# Patient Record
Sex: Female | Born: 1955 | Race: White | Hispanic: No | Marital: Married | Smoking: Never smoker
Health system: Southern US, Community
[De-identification: ages and names within clinical notes are randomized; demographics above are authoritative.]

## PROBLEM LIST (undated history)

## (undated) DIAGNOSIS — I10 Essential (primary) hypertension: Secondary | ICD-10-CM

## (undated) DIAGNOSIS — G5601 Carpal tunnel syndrome, right upper limb: Secondary | ICD-10-CM

## (undated) HISTORY — DX: Essential (primary) hypertension: I10

## (undated) HISTORY — PX: KNEE ARTHROSCOPY: SHX127

## (undated) HISTORY — DX: Carpal tunnel syndrome, right upper limb: G56.01

## (undated) HISTORY — PX: HAMMER TOE SURGERY: SHX385

---

## 2007-01-16 ENCOUNTER — Ambulatory Visit (HOSPITAL_BASED_OUTPATIENT_CLINIC_OR_DEPARTMENT_OTHER): Admission: RE | Admit: 2007-01-16 | Discharge: 2007-01-16 | Payer: Self-pay | Admitting: Orthopedic Surgery

## 2010-10-23 NOTE — Op Note (Signed)
NAME:  Faith Montgomery, Faith Montgomery              ACCOUNT NO.:  [id number]   MEDICAL RECORD NO.:  [id number]          PATIENT TYPE:  AMB   LOCATION:  DSC                          FACILITY:  [facility]   PHYSICIAN:  Dyke Brackett, M.D.    DATE OF BIRTH:  1956-03-29   DATE OF PROCEDURE:  DATE OF DISCHARGE:                               OPERATIVE REPORT   INDICATIONS FOR PROCEDURE:  55 year old with medial joint line pain and  locking of the right knee, thought to be amenable to outpatient surgery,  not responding to conservative treatment.   PREOPERATIVE DIAGNOSIS:  Torn medial meniscus.   POSTOPERATIVE DIAGNOSIS:  1. Torn medial meniscus.  2. Osteoarthritis of the right knee.   OPERATION:  1. Partial medial meniscectomy.  2. Debridement and chondroplasty patellofemoral joint.   SURGEON:  Dyke Brackett, MD   ANESTHESIA:  General with local.   DESCRIPTION OF PROCEDURE:  Inferomedial and inferolateral portals  created.  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  The lateral compartment was normal.  The medial meniscus  showed a complex tear of the posterior horn of the medial meniscus  requiring resection of 30-40% of the meniscal substance.  Mild  degenerative change overlying this compartment was debrided and again,  separate debridement chondroplasty of this patellofemoral joint carried  out separate from the medial meniscectomy.  The knee drained free of  fluid.  Portals were closed with nylon, infiltrated with Marcaine and  morphine with some additional Marcaine in the portals.  Patient taken to  recovery room in stable condition.      Dyke Brackett, M.D.  Electronically Signed     WDC/MEDQ  D:  01/16/2007  T:  01/17/2007  Job:  [id number]

## 2011-05-14 ENCOUNTER — Ambulatory Visit (HOSPITAL_COMMUNITY)
Admission: RE | Admit: 2011-05-14 | Discharge: 2011-05-14 | Disposition: A | Discharge status: Home / Self Care | Payer: BC Managed Care – PPO | Source: Ambulatory Visit | Attending: Orthopedic Surgery | Admitting: Orthopedic Surgery

## 2011-05-14 DIAGNOSIS — IMO0001 Reserved for inherently not codable concepts without codable children: Secondary | ICD-10-CM | POA: Insufficient documentation

## 2011-05-14 DIAGNOSIS — R262 Difficulty in walking, not elsewhere classified: Secondary | ICD-10-CM | POA: Insufficient documentation

## 2011-05-14 DIAGNOSIS — M25669 Stiffness of unspecified knee, not elsewhere classified: Secondary | ICD-10-CM | POA: Insufficient documentation

## 2011-05-14 DIAGNOSIS — M25569 Pain in unspecified knee: Secondary | ICD-10-CM | POA: Insufficient documentation

## 2011-05-14 NOTE — Progress Notes (Signed)
Physical Therapy Evaluation  Patient Details  Name: Faith Montgomery MRN: [id number] Date of Birth: 06-02-1956  Today's Date: 05/14/2011 Time: 4098-1191 Time Calculation (min): 31 min Visit#: 1  of 1   Re-eval:   Assessment Diagnosis: L arthroscopic surgery with stiffness Surgical Date: 05/08/11 Next MD Visit: 06/13/2011 Prior Therapy: none  Past Medical History: No past medical history on file. Past Surgical History: No past surgical history on file.  Subjective Symptoms/Limitations Symptoms: Faith Montgomery states that she torn her meniscus and had arthroscopic surgery on 05/08/11.  The pateint states that she is doing much better since the surgery. How long can you sit comfortably?: The patient states that she can sit comfortable for a long time but if she goes to get up her knee feels stiff. How long can you stand comfortably?: The patietn states that she has not really stood for a long time but showering and washing dishes is no problem. How long can you walk comfortably?: The patient states that she has walked for 30 minutes without difficulty. 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 Leisure: Hobbies-yes (Comment) Comments: clean houses.  Sensation/Coordination/Flexibility    Assessment LLE AROM (degrees) Left Knee Extension 0-130: 0  Left Knee Flexion 0-140: 130  LLE Strength Left Hip Flexion: 5/5 Left Hip Extension: 5/5 Left Hip ABduction: 5/5 Left Hip ADduction: 5/5 Left Knee Flexion: 5/5 Left Knee Extension: 5/5 Left Ankle Dorsiflexion: 5/5  Exercise/Treatments Stretches Quad Stretch: 3 reps;30 seconds   Standing Heel Raises: 10 reps Functional Squat: 10 reps SLS:  (3 x 12 sec.)    Supine Quad Sets: 5 reps      Physical  Therapy Assessment and Plan PT Assessment and Plan Clinical Impression Statement: Pt demonstrates good ROM and strength.  Pt will be able to reach full prior level of functioning with a HEP Clinical Impairments Affecting Rehab Potential: stiffness PT Plan: Pt to be seen one time for a HEP    Goals Home Exercise Program Pt will Perform Home Exercise Program: Independently PT Short Term Goals Time to Complete Short Term Goals: 2 weeks  Problem List There is no problem list on file for this patient.   PT - End of Session Activity Tolerance: Patient tolerated treatment well General Behavior During Session: [facility] for tasks performed Cognition: [facility] for tasks performed   RUSSELL,CINDY 05/14/2011, 9:51 AM  Physician Documentation Your signature is required to indicate approval of the treatment plan as stated above.  Please sign and either send electronically or make a copy of this report for your files and return this physician signed original.   Please mark one 1.__approve of plan  2. ___approve of plan with the following conditions.   ______________________________                                                          _____________________ Physician Signature  Date  

## 2011-05-14 NOTE — Patient Instructions (Addendum)
HEP

## 2012-03-16 LAB — HM PAP SMEAR: HM Pap smear: NORMAL

## 2012-03-20 ENCOUNTER — Other Ambulatory Visit: Payer: Self-pay | Admitting: Family Medicine

## 2012-03-20 DIAGNOSIS — Z139 Encounter for screening, unspecified: Secondary | ICD-10-CM

## 2012-03-23 ENCOUNTER — Ambulatory Visit (HOSPITAL_COMMUNITY)
Admission: RE | Admit: 2012-03-23 | Discharge: 2012-03-23 | Disposition: A | Discharge status: Home / Self Care | Payer: BC Managed Care – PPO | Source: Ambulatory Visit | Attending: Family Medicine | Admitting: Family Medicine

## 2012-03-23 DIAGNOSIS — Z1231 Encounter for screening mammogram for malignant neoplasm of breast: Secondary | ICD-10-CM | POA: Insufficient documentation

## 2012-03-23 DIAGNOSIS — Z139 Encounter for screening, unspecified: Secondary | ICD-10-CM

## 2012-04-02 ENCOUNTER — Telehealth: Payer: Self-pay

## 2012-04-02 NOTE — Telephone Encounter (Signed)
LMOM to call.

## 2012-04-20 NOTE — Telephone Encounter (Signed)
Pt said she had to go with someone in [place], she was told by her insurance that we were not in her network. She said she was sent to Dr. Loreta Ave. Sending letter to PCP.

## 2013-03-16 ENCOUNTER — Other Ambulatory Visit: Payer: Self-pay | Admitting: Family Medicine

## 2013-03-16 DIAGNOSIS — Z0000 Encounter for general adult medical examination without abnormal findings: Secondary | ICD-10-CM

## 2013-03-17 ENCOUNTER — Other Ambulatory Visit: Payer: BC Managed Care – PPO

## 2013-03-17 DIAGNOSIS — Z0000 Encounter for general adult medical examination without abnormal findings: Secondary | ICD-10-CM

## 2013-03-17 LAB — CBC WITH DIFFERENTIAL/PLATELET
Basophils Absolute: 0 10*3/uL (ref 0.0–0.1)
Basophils Relative: 1 % (ref 0–1)
Eosinophils Absolute: 0.2 10*3/uL (ref 0.0–0.7)
Eosinophils Relative: 4 % (ref 0–5)
HCT: 41.6 % (ref 36.0–46.0)
Hemoglobin: 13.9 g/dL (ref 12.0–15.0)
MCH: 30.3 pg (ref 26.0–34.0)
MCHC: 33.4 g/dL (ref 30.0–36.0)
MCV: 90.8 fL (ref 78.0–100.0)
Monocytes Absolute: 0.3 10*3/uL (ref 0.1–1.0)
Monocytes Relative: 8 % (ref 3–12)
RDW: 13.2 % (ref 11.5–15.5)

## 2013-03-17 LAB — COMPREHENSIVE METABOLIC PANEL
Albumin: 4.4 g/dL (ref 3.5–5.2)
Alkaline Phosphatase: 69 U/L (ref 39–117)
BUN: 14 mg/dL (ref 6–23)
Creat: 0.68 mg/dL (ref 0.50–1.10)
Glucose, Bld: 103 mg/dL — ABNORMAL HIGH (ref 70–99)
Potassium: 4.9 mEq/L (ref 3.5–5.3)
Total Bilirubin: 0.6 mg/dL (ref 0.3–1.2)

## 2013-03-17 LAB — LIPID PANEL
HDL: 59 mg/dL (ref >39)
LDL Cholesterol: 106 mg/dL — ABNORMAL HIGH (ref 0–99)
Total CHOL/HDL Ratio: 3.1 Ratio
Triglycerides: 73 mg/dL (ref <150)

## 2013-03-25 ENCOUNTER — Encounter: Payer: Self-pay | Admitting: Physician Assistant

## 2013-03-25 ENCOUNTER — Ambulatory Visit (INDEPENDENT_AMBULATORY_CARE_PROVIDER_SITE_OTHER): Payer: BC Managed Care – PPO | Admitting: Physician Assistant

## 2013-03-25 VITALS — BP 110/78 | HR 84 | Temp 97.9°F | Resp 18 | Ht 60.5 in | Wt 161.0 lb

## 2013-03-25 DIAGNOSIS — Z0000 Encounter for general adult medical examination without abnormal findings: Secondary | ICD-10-CM

## 2013-03-25 DIAGNOSIS — I10 Essential (primary) hypertension: Secondary | ICD-10-CM

## 2013-03-25 DIAGNOSIS — Z23 Encounter for immunization: Secondary | ICD-10-CM

## 2013-03-25 NOTE — Progress Notes (Signed)
Patient ID: Faith Montgomery MRN: [id number], DOB: 11/17/1955, 57 y.o. Date of Encounter: 03/25/2013,   Chief Complaint: Physical (CPE)  HPI: 57 y.o. y/o white female  here for CPE.   She has no complaints and has been feeling good.  She is taking her blood pressure medication as directed. No adverse effects. No lightheadedness.   Review of Systems: Consitutional: No fever, chills, fatigue, night sweats, lymphadenopathy. No significant/unexplained weight changes. Eyes: No visual changes, eye redness, or discharge. ENT/Mouth: No ear pain, sore throat, nasal drainage, or sinus pain. Cardiovascular: No chest pressure,heaviness, tightness or squeezing, even with exertion. No increased shortness of breath or dyspnea on exertion.No palpitations, edema, orthopnea, PND. Respiratory: No cough, hemoptysis, SOB, or wheezing. Gastrointestinal: No anorexia, dysphagia, reflux, pain, nausea, vomiting, hematemesis, diarrhea, constipation, BRBPR, or melena. Breast: No mass, nodules, bulging, or retraction. No skin changes or inflammation. No nipple discharge. No lymphadenopathy. Genitourinary: No dysuria, hematuria, incontinence, vaginal discharge, pruritis, burning, abnormal bleeding, or pain. Musculoskeletal: No decreased ROM, No joint pain or swelling. No significant pain in neck, back, or extremities. Skin: No rash, pruritis, or concerning lesions. Neurological: No headache, dizziness, syncope, seizures, tremors, memory loss, coordination problems, or paresthesias. Psychological: No anxiety, depression, hallucinations, SI/HI. Endocrine: No polydipsia, polyphagia, polyuria, or known diabetes.No increased fatigue. No palpitations/rapid heart rate. No significant/unexplained weight change. All other systems were reviewed and are otherwise negative.  Past Medical History  Diagnosis Date  . Hypertension      Past Surgical History  Procedure Laterality Date  . Knee arthroscopy      both knees at  different times    Home Meds:  Benazepril 10 mg 1 by mouth daily  Allergies: No Known Allergies  History   Social History  . Marital Status: Married    Spouse Name: N/A    Number of Children: N/A  . Years of Education: N/A   Occupational History  . School [employer]   . Cleans Houses    Social History Main Topics  . Smoking status: Never Smoker   . Smokeless tobacco: Never Used  . Alcohol Use: No  . Drug Use: No  . Sexual Activity: Yes    Birth Control/ Protection: Post-menopausal   Other Topics Concern  . Not on file   Social History Narrative   Married. Lives with husband.    3 children. 1 Boy: Age 54             (as of 20)                      2 Girls: 16, 57 y/o      (as of 2014)      Works in [employer].   And cleans houses.      Walks with husband every night for 30 minutes.    Family History  Problem Relation Age of Onset  . Heart disease Father   . Diabetes Father   . Stroke Brother 50    stroke    Physical Exam: Blood pressure 110/78, pulse 84, temperature 97.9 F (36.6 C), temperature source Oral, resp. rate 18, height 5' 0.5" (1.537 m), weight 161 lb (73.029 kg)., Body mass index is 30.91 kg/(m^2). General: Well developed, well nourished,WF. Appears in no acute distress. HEENT: Normocephalic, atraumatic. Conjunctiva pink, sclera non-icteric. Pupils 2 mm constricting to 1 mm, round, regular, and equally reactive to light and accomodation. EOMI. Internal auditory canal clear. TMs with good cone of light and without pathology.  Nasal mucosa pink. Nares are without discharge. No sinus tenderness. Oral mucosa pink.  Pharynx without exudate.   Neck: Supple. Trachea midline. No thyromegaly. Full ROM. No lymphadenopathy.No Carotid Bruits. Lungs: Clear to auscultation bilaterally without wheezes, rales, or rhonchi. Breathing is of normal effort and unlabored. Cardiovascular: RRR with S1 S2. No murmurs, rubs, or gallops. Distal pulses 2+ symmetrically.  No carotid or abdominal bruits. Breast: Symmetrical. No masses. Nipples without discharge. Abdomen: Soft, non-tender, non-distended with normoactive bowel sounds. No hepatosplenomegaly or masses. No rebound/guarding. No CVA tenderness. No hernias.  Genitourinary:  External genitalia without lesions. Vaginal mucosa pink.No discharge present. Cervix pink and without discharge. No cervical tenderness.Normal uterus size. No adnexal mass or tenderness.   Musculoskeletal: Full range of motion and 5/5 strength throughout. Without swelling, atrophy, tenderness, crepitus, or warmth. Extremities without clubbing, cyanosis, or edema. Calves supple. Skin: Warm and moist without erythema, ecchymosis, wounds, or rash. Neuro: A+Ox3. CN II-XII grossly intact. Moves all extremities spontaneously. Full sensation throughout. Normal gait. DTR 2+ throughout upper and lower extremities. Finger to nose intact. Psych:  Responds to questions appropriately with a normal affect.   Assessment/Plan:  57 y.o. y/o female here for CPE 1. Visit for preventive health examination  A. greening labs: She came fasting and have these done 03/18/13. All are normal. This includes CBC, CMET, FLP,  TSH, vitamin D.  B. Pap Smear: She had Pap smear with me 03/16/12. This was negative for intraepithelial lesion or malignancy. That Pap smear was cytology alone so she will need a repeat in 3 years.  C. Screening mammogram: Last mammogram 03/23/12. Negative.     She is due for annual followup. I have given her the phone number to call to schedule this for herself.  D. colorectal cancer screening: We discussed this last year and I did referral to GI. She did go to the appointment with GI. However she says it her insurance coverage would not cover the colonoscopy. Therefore she did not have the procedure done. However she has been told it her insurance coverage has been changed and she feels that the knee plan doesn't cover the colonoscopy. She  is going to contact the insurance company to verify. If colonoscopy is covered and she will followup with having this performed. It is not covered and she needs to return here for Hemoccult cards.  E. Immunizations:  She is agreeable to have influenza vaccine today. Tetanus vaccine was given here 03/25/2012 as Tdap. Discuss Zostavax at age 31 Pneumovax at age 21.  2. Hypertension Pressure is at goal. Continue current medication. Lab is normal.  3. Need for prophylactic vaccination and inoculation against influenza - Flu Vaccine QUAD 36+ mos PF IM (Fluarix)   Signed, [address] [place], [place], [facility] 03/25/2013 3:43 PM

## 2013-03-29 ENCOUNTER — Other Ambulatory Visit: Payer: Self-pay | Admitting: Physician Assistant

## 2013-03-29 DIAGNOSIS — Z139 Encounter for screening, unspecified: Secondary | ICD-10-CM

## 2013-04-13 ENCOUNTER — Ambulatory Visit (HOSPITAL_COMMUNITY)
Admission: RE | Admit: 2013-04-13 | Discharge: 2013-04-13 | Disposition: A | Discharge status: Home / Self Care | Payer: BC Managed Care – PPO | Source: Ambulatory Visit | Attending: Physician Assistant | Admitting: Physician Assistant

## 2013-04-13 DIAGNOSIS — Z1231 Encounter for screening mammogram for malignant neoplasm of breast: Secondary | ICD-10-CM | POA: Insufficient documentation

## 2013-04-13 DIAGNOSIS — Z139 Encounter for screening, unspecified: Secondary | ICD-10-CM

## 2013-04-20 ENCOUNTER — Other Ambulatory Visit: Payer: Self-pay | Admitting: Physician Assistant

## 2013-09-30 ENCOUNTER — Ambulatory Visit (INDEPENDENT_AMBULATORY_CARE_PROVIDER_SITE_OTHER): Payer: BC Managed Care – PPO | Admitting: Family Medicine

## 2013-09-30 ENCOUNTER — Encounter: Payer: Self-pay | Admitting: Family Medicine

## 2013-09-30 VITALS — BP 110/78 | HR 74 | Temp 97.0°F | Resp 18 | Ht 60.5 in | Wt 163.0 lb

## 2013-09-30 DIAGNOSIS — R300 Dysuria: Secondary | ICD-10-CM

## 2013-09-30 DIAGNOSIS — I10 Essential (primary) hypertension: Secondary | ICD-10-CM

## 2013-09-30 MED ORDER — LOSARTAN POTASSIUM 50 MG PO TABS: 50.0000 mg | ORAL_TABLET | Freq: Every day | ORAL | Status: DC

## 2013-09-30 NOTE — Progress Notes (Signed)
Subjective:    Patient ID: Faith Montgomery, female    DOB: September 30, 1955, 58 y.o.   MRN: [id number]  HPI  Patient is a very pleasant 58 year old white female who has a history of hypertension. She's currently on benazepril 10 mg by mouth daily. Meds is working great for her blood pressure but she does complain of a daily persistent nagging cough. She denies a shortness of breath denies any hemoptysis. She also complains of a one month history of increased urinary frequency. The emergency room after suddenly in July to rest to the bathroom. Because when she gets there she is unable to urinate. She denies any dysuria. Unfortunately she is on a set today which makes the urinalysis unreliable. However her symptoms sound more like overactive bladder and less likely urinary tract infection. Overall she is doing well. I reviewed her lab work from October which was outstanding. She is not due for repeat labs until her physical in October. Past Medical History  Diagnosis Date  . Hypertension    Current Outpatient Prescriptions on File Prior to Visit  Medication Sig Dispense Refill  . benazepril (LOTENSIN) 10 MG tablet take 1 tablet by mouth once daily  30 tablet  11   No current facility-administered medications on file prior to visit.   No Known Allergies History   Social History  . Marital Status: Married    Spouse Name: N/A    Number of Children: N/A  . Years of Education: N/A   Occupational History  . School [employer]   . Cleans Houses    Social History Main Topics  . Smoking status: Never Smoker   . Smokeless tobacco: Never Used  . Alcohol Use: No  . Drug Use: No  . Sexual Activity: Yes    Birth Control/ Protection: Post-menopausal   Other Topics Concern  . Not on file   Social History Narrative   Married. Lives with husband.    3 children. 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      Walks with husband every night for 30 minutes.     Review of Systems  All other systems reviewed and are negative.      Objective:   Physical Exam  Vitals reviewed. Constitutional: She appears well-developed and well-nourished. No distress.  Neck: Neck supple. No JVD present. No thyromegaly present.  Cardiovascular: Normal rate, regular rhythm and normal heart sounds.   No murmur heard. Pulmonary/Chest: Effort normal and breath sounds normal. No respiratory distress. She has no wheezes. She has no rales. She exhibits no tenderness.  Abdominal: Soft. Bowel sounds are normal. She exhibits no distension. There is no tenderness. There is no rebound and no guarding.  Musculoskeletal: She exhibits no edema.  Lymphadenopathy:    She has no cervical adenopathy.  Skin: She is not diaphoretic.          Assessment & Plan:  1. HTN (hypertension) Blood pressures well controlled. I have asked the patient to discontinue benazepril and replace it with losartan 50 mg by mouth daily for her blood pressure. However this will help the cough subsided. She is due for a physical exam in  October. I would repeat all her lab work annually and her physical exam - losartan (COZAAR) 50 MG tablet; Take 1 tablet (50 mg total) by mouth daily.  Dispense: 90 tablet; Refill: 3  2. Dysuria I believe the patient has overactive bladder. Because of the azo, I will not check a urinalysis. I will send the patient's urine for a urine culture. If the culture is negative I will empirically try treating her for overactive bladder with VESIcare 10 mg by mouth daily. - Urine culture

## 2013-10-01 NOTE — Addendum Note (Signed)
Addended by: WRAY, [place] on: 10/01/2013 10:57 AM   Modules accepted: Orders

## 2013-10-03 LAB — URINE CULTURE: Colony Count: >=100000

## 2013-10-07 ENCOUNTER — Other Ambulatory Visit: Payer: Self-pay | Admitting: Family Medicine

## 2013-10-07 MED ORDER — CIPROFLOXACIN HCL 500 MG PO TABS: 500.0000 mg | ORAL_TABLET | Freq: Two times a day (BID) | ORAL | Status: DC

## 2014-03-22 ENCOUNTER — Other Ambulatory Visit: Payer: BC Managed Care – PPO

## 2014-03-22 DIAGNOSIS — Z79899 Other long term (current) drug therapy: Secondary | ICD-10-CM

## 2014-03-22 DIAGNOSIS — Z0000 Encounter for general adult medical examination without abnormal findings: Secondary | ICD-10-CM

## 2014-03-22 DIAGNOSIS — I10 Essential (primary) hypertension: Secondary | ICD-10-CM

## 2014-03-22 LAB — CBC WITH DIFFERENTIAL/PLATELET
Basophils Absolute: 0 10*3/uL (ref 0.0–0.1)
Basophils Relative: 1 % (ref 0–1)
EOS ABS: 0.1 10*3/uL (ref 0.0–0.7)
Eosinophils Relative: 3 % (ref 0–5)
HCT: 40.8 % (ref 36.0–46.0)
HEMOGLOBIN: 13.7 g/dL (ref 12.0–15.0)
LYMPHS ABS: 1.3 10*3/uL (ref 0.7–4.0)
LYMPHS PCT: 28 % (ref 12–46)
MCH: 30.3 pg (ref 26.0–34.0)
MCHC: 33.6 g/dL (ref 30.0–36.0)
MCV: 90.3 fL (ref 78.0–100.0)
MONOS PCT: 9 % (ref 3–12)
Monocytes Absolute: 0.4 10*3/uL (ref 0.1–1.0)
NEUTROS PCT: 59 % (ref 43–77)
Neutro Abs: 2.7 10*3/uL (ref 1.7–7.7)
PLATELETS: 180 10*3/uL (ref 150–400)
RBC: 4.52 MIL/uL (ref 3.87–5.11)
RDW: 14.1 % (ref 11.5–15.5)
WBC: 4.5 10*3/uL (ref 4.0–10.5)

## 2014-03-22 LAB — COMPLETE METABOLIC PANEL WITH GFR
ALBUMIN: 4.3 g/dL (ref 3.5–5.2)
ALK PHOS: 64 U/L (ref 39–117)
ALT: 10 U/L (ref 0–35)
AST: 14 U/L (ref 0–37)
BUN: 17 mg/dL (ref 6–23)
CALCIUM: 9.4 mg/dL (ref 8.4–10.5)
CHLORIDE: 105 meq/L (ref 96–112)
CO2: 27 mEq/L (ref 19–32)
Creat: 0.71 mg/dL (ref 0.50–1.10)
GFR, Est African American: >89 mL/min
GFR, Est Non African American: >89 mL/min
Glucose, Bld: 88 mg/dL (ref 70–99)
POTASSIUM: 4.3 meq/L (ref 3.5–5.3)
SODIUM: 140 meq/L (ref 135–145)
TOTAL PROTEIN: 6.6 g/dL (ref 6.0–8.3)
Total Bilirubin: 0.6 mg/dL (ref 0.2–1.2)

## 2014-03-22 LAB — LIPID PANEL
CHOLESTEROL: 168 mg/dL (ref 0–200)
HDL: 62 mg/dL (ref >39)
LDL Cholesterol: 96 mg/dL (ref 0–99)
Total CHOL/HDL Ratio: 2.7 Ratio
Triglycerides: 50 mg/dL (ref <150)
VLDL: 10 mg/dL (ref 0–40)

## 2014-03-22 LAB — TSH: TSH: 1.393 u[IU]/mL (ref 0.350–4.500)

## 2014-03-23 LAB — VITAMIN D 25 HYDROXY (VIT D DEFICIENCY, FRACTURES): VIT D 25 HYDROXY: 37 ng/mL (ref 30–89)

## 2014-03-28 ENCOUNTER — Ambulatory Visit (INDEPENDENT_AMBULATORY_CARE_PROVIDER_SITE_OTHER): Payer: BC Managed Care – PPO | Admitting: Physician Assistant

## 2014-03-28 ENCOUNTER — Encounter: Payer: Self-pay | Admitting: Physician Assistant

## 2014-03-28 VITALS — BP 112/70 | HR 68 | Temp 98.0°F | Resp 18 | Ht 60.75 in | Wt 164.0 lb

## 2014-03-28 DIAGNOSIS — Z1212 Encounter for screening for malignant neoplasm of rectum: Secondary | ICD-10-CM

## 2014-03-28 DIAGNOSIS — I10 Essential (primary) hypertension: Secondary | ICD-10-CM

## 2014-03-28 DIAGNOSIS — Z23 Encounter for immunization: Secondary | ICD-10-CM

## 2014-03-28 DIAGNOSIS — Z1211 Encounter for screening for malignant neoplasm of colon: Secondary | ICD-10-CM

## 2014-03-28 DIAGNOSIS — Z0000 Encounter for general adult medical examination without abnormal findings: Secondary | ICD-10-CM

## 2014-03-28 NOTE — Progress Notes (Signed)
Patient ID: Faith Montgomery MRN: [id number], DOB: 1956/01/29, 58 y.o. Date of Encounter: 03/28/2014,   Chief Complaint: Physical (CPE)  HPI: 58 y.o. y/o white female  here for CPE.   She has no complaints and has been feeling good.  She is taking her blood pressure medication as directed. No adverse effects. No lightheadedness.  She had OV with Dr. Dennard Schaumann 09/2013 regarding possible UTI. At that Ov he noted she had dry hacky cough. He stopped her benazepril and started losartan. She says dry hacky cough resolved. Has no adverse effects with losartan.   She reports she has had no further urinary symptoms and did not need med he prescribed for overactive bladder.  She had CPE with me October 2014--one year ago--today we reviewed PMH, Family Hx etc--no other new updates other than above.     Review of Systems: Consitutional: No fever, chills, fatigue, night sweats, lymphadenopathy. No significant/unexplained weight changes. Eyes: No visual changes, eye redness, or discharge. ENT/Mouth: No ear pain, sore throat, nasal drainage, or sinus pain. Cardiovascular: No chest pressure,heaviness, tightness or squeezing, even with exertion. No increased shortness of breath or dyspnea on exertion.No palpitations, edema, orthopnea, PND. Respiratory: No cough, hemoptysis, SOB, or wheezing. Gastrointestinal: No anorexia, dysphagia, reflux, pain, nausea, vomiting, hematemesis, diarrhea, constipation, BRBPR, or melena. Breast: No mass, nodules, bulging, or retraction. No skin changes or inflammation. No nipple discharge. No lymphadenopathy. Genitourinary: No dysuria, hematuria, incontinence, vaginal discharge, pruritis, burning, abnormal bleeding, or pain. Musculoskeletal: No decreased ROM, No joint pain or swelling. No significant pain in neck, back, or extremities. Skin: No rash, pruritis, or concerning lesions. Neurological: No headache, dizziness, syncope, seizures, tremors, memory loss, coordination  problems, or paresthesias. Psychological: No anxiety, depression, hallucinations, SI/HI. Endocrine: No polydipsia, polyphagia, polyuria, or known diabetes.No increased fatigue. No palpitations/rapid heart rate. No significant/unexplained weight change. All other systems were reviewed and are otherwise negative.  Past Medical History  Diagnosis Date  . Hypertension      Past Surgical History  Procedure Laterality Date  . Knee arthroscopy      both knees at different times    Home Meds:  Outpatient Prescriptions Prior to Visit  Medication Sig Dispense Refill  . losartan (COZAAR) 50 MG tablet Take 1 tablet (50 mg total) by mouth daily.  90 tablet  3  .      .      .         No facility-administered medications prior to visit.     Allergies: No Known Allergies  History   Social History  . Marital Status: Married    Spouse Name: N/A    Number of Children: N/A  . Years of Education: N/A   Occupational History  . School [employer]   . Cleans Houses    Social History Main Topics  . Smoking status: Never Smoker   . Smokeless tobacco: Never Used  . Alcohol Use: No  . Drug Use: No  . Sexual Activity: Yes    Birth Control/ Protection: Post-menopausal   Other Topics Concern  . Not on file   Social History Narrative   Married. Lives with husband.    Entered 2015--Daughter and Granddaughter live with them now.    3 children. 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      Walks with husband every night for 30 minutes.( This was in 2014)   In 2015--pt says they walk sometimes.    She is active with granddaughter even on days they do not walk.    Family History  Problem Relation Age of Onset  . Heart disease Father   . Diabetes Father   . Stroke Brother 50    stroke    Physical Exam: Blood pressure 112/70, pulse 68, temperature 98 F (36.7 C),  temperature source Oral, resp. rate 18, height 5' 0.75" (1.543 m), weight 164 lb (74.39 kg)., Body mass index is 31.25 kg/(m^2). General: Well developed, well nourished,WF. Appears in no acute distress. HEENT: Normocephalic, atraumatic. Conjunctiva pink, sclera non-icteric. Pupils 2 mm constricting to 1 mm, round, regular, and equally reactive to light and accomodation. EOMI. Internal auditory canal clear. TMs with good cone of light and without pathology. Nasal mucosa pink. Nares are without discharge. No sinus tenderness. Oral mucosa pink.  Pharynx without exudate.   Neck: Supple. Trachea midline. No thyromegaly. Full ROM. No lymphadenopathy.No Carotid Bruits. Lungs: Clear to auscultation bilaterally without wheezes, rales, or rhonchi. Breathing is of normal effort and unlabored. Cardiovascular: RRR with S1 S2. No murmurs, rubs, or gallops. Distal pulses 2+ symmetrically. No carotid or abdominal bruits. Breast: Symmetrical. No masses. Nipples without discharge. Abdomen: Soft, non-tender, non-distended with normoactive bowel sounds. No hepatosplenomegaly or masses. No rebound/guarding. No CVA tenderness. No hernias.  Genitourinary:  External genitalia without lesions. Vaginal mucosa pink.No discharge present. Cervix pink and without discharge. No cervical tenderness.Normal uterus size. No adnexal mass or tenderness.   Musculoskeletal: Full range of motion and 5/5 strength throughout. Without swelling, atrophy, tenderness, crepitus, or warmth. Extremities without clubbing, cyanosis, or edema. Calves supple. Skin: Warm and moist without erythema, ecchymosis, wounds, or rash. Neuro: A+Ox3. CN II-XII grossly intact. Moves all extremities spontaneously. Full sensation throughout. Normal gait. DTR 2+ throughout upper and lower extremities. Finger to nose intact. Psych:  Responds to questions appropriately with a normal affect.   Assessment/Plan:  58 y.o. y/o female here for CPE 1. Visit for preventive  health examination  A.Screening labs: She came fasting and had these done 03/22/2014 All are normal. This includes CBC, CMET, FLP,  TSH, vitamin D.  B. Pap Smear: She had Pap smear with me 03/16/12. This was negative for intraepithelial lesion or malignancy. That Pap smear was cytology alone so she will need a repeat in 3 years.  C. Screening mammogram: Last mammogram 03/2013 --patient states she had this performed at [facility]. States that she just got a letter from them that she is due to schedule followup and she will followup with scheduling this.  D. colorectal cancer screening: We discussed this in 2013 and I did referral to GI. She did go to the appointment with GI. However at her f/u OV with me 03/2013  she said that her insurance coverage would not cover the colonoscopy. Therefore she did not have the procedure done. However, at her CPE 03/2013 she said she had been told that her insurance coverage had been changed.  She was going to contact the insurance company to verify. If colonoscopy is covered and she will followup with having this performed. However, today--03/2014--she says that she forgot to check regarding the insurance coverage and never did anything about this. Today-- 03/2014-- on her  AVS --I wrote down for her to check with her insurance regarding coverage for colonoscopy. If current insurance will pay for colonoscopy, then she will call me and I will schedule referral. Last year her 03/2013 had said that if it was not covered and she needed to return here for Hemoccult cards. However she forgot about the whole issue and never followed up with either thinning. Today I have gone ahead and given her the Hemoccult cards while she is here she is to do these as well as find out whether insurance will pay for the colonoscopy or not.  Visit for preventive health examination - Fecal occult blood, imunochemical; Future - Fecal occult blood, imunochemical; Future - Fecal occult  blood, imunochemical; Future   Screening for colorectal cancer - Fecal occult blood, imunochemical; Future - Fecal occult blood, imunochemical; Future - Fecal occult blood, imunochemical; Future   E. Immunizations:  She is agreeable to have influenza vaccine today. Tetanus vaccine was given here 03/25/2012 as Tdap. Discuss Zostavax at age 21 Pneumonia Vaccines  at age 36.  2. Hypertension Blood Pressure is at goal. Continue current medication. Lab is normal.   4. Need for prophylactic vaccination and inoculation against influenza - Flu Vaccine QUAD 36+ mos PF IM (Fluarix Quad PF)   Signed, [address] [place], [place], [facility] 03/28/2014 4:11 PM

## 2014-03-31 ENCOUNTER — Other Ambulatory Visit: Payer: Self-pay | Admitting: Physician Assistant

## 2014-03-31 ENCOUNTER — Other Ambulatory Visit: Payer: Self-pay | Admitting: Family Medicine

## 2014-03-31 DIAGNOSIS — Z1211 Encounter for screening for malignant neoplasm of colon: Secondary | ICD-10-CM

## 2014-03-31 DIAGNOSIS — Z1231 Encounter for screening mammogram for malignant neoplasm of breast: Secondary | ICD-10-CM

## 2014-04-04 ENCOUNTER — Encounter (INDEPENDENT_AMBULATORY_CARE_PROVIDER_SITE_OTHER): Payer: Self-pay | Admitting: *Deleted

## 2014-04-06 ENCOUNTER — Other Ambulatory Visit: Payer: Self-pay | Admitting: Physician Assistant

## 2014-04-06 NOTE — Addendum Note (Signed)
Addended by: Sharmon Revere on: 04/06/2014 03:09 PM   Modules accepted: Orders

## 2014-04-08 LAB — FECAL OCCULT BLOOD, IMMUNOCHEMICAL
Fecal Occult Blood: NEGATIVE
Fecal Occult Blood: NEGATIVE
Fecal Occult Blood: NEGATIVE

## 2014-04-12 ENCOUNTER — Encounter: Payer: Self-pay | Admitting: Family Medicine

## 2014-04-18 ENCOUNTER — Ambulatory Visit (HOSPITAL_COMMUNITY)
Admission: RE | Admit: 2014-04-18 | Discharge: 2014-04-18 | Disposition: A | Discharge status: Home / Self Care | Payer: BC Managed Care – PPO | Source: Ambulatory Visit | Attending: Physician Assistant | Admitting: Physician Assistant

## 2014-04-18 DIAGNOSIS — Z1231 Encounter for screening mammogram for malignant neoplasm of breast: Secondary | ICD-10-CM | POA: Diagnosis present

## 2014-04-19 NOTE — Progress Notes (Signed)
Mammogram showing up in Overdue Results. Faith Montgomery can you call patient in follow-up regarding this.

## 2014-04-27 ENCOUNTER — Other Ambulatory Visit (INDEPENDENT_AMBULATORY_CARE_PROVIDER_SITE_OTHER): Payer: Self-pay | Admitting: *Deleted

## 2014-04-27 DIAGNOSIS — Z1211 Encounter for screening for malignant neoplasm of colon: Secondary | ICD-10-CM

## 2014-06-08 ENCOUNTER — Telehealth: Payer: Self-pay | Admitting: Family Medicine

## 2014-06-08 NOTE — Telephone Encounter (Signed)
Patient is calling to ask some questions about where her colonscopy is being done, and if we maybe could schedule somewhere else for her, according to insurance because of where she is going it is going to cost her more than 1000.00 Please call her at [phone]

## 2014-06-13 NOTE — Telephone Encounter (Signed)
lmtrc

## 2014-06-14 ENCOUNTER — Encounter (INDEPENDENT_AMBULATORY_CARE_PROVIDER_SITE_OTHER): Payer: Self-pay | Admitting: *Deleted

## 2014-06-14 NOTE — Telephone Encounter (Signed)
Patient needs movi prep 

## 2014-06-14 NOTE — Telephone Encounter (Signed)
lmtrc

## 2014-06-16 ENCOUNTER — Encounter: Payer: Self-pay | Admitting: Family Medicine

## 2014-06-16 NOTE — Telephone Encounter (Signed)
Per patient cancel TCS, insurance won't pay for it to be done at hospital  This encounter was created in error - please disregard.

## 2014-06-17 NOTE — Telephone Encounter (Signed)
Pt wants to go to someone who performs at their own office.

## 2014-06-29 NOTE — Telephone Encounter (Signed)
Sent referral to [facility]

## 2014-07-27 ENCOUNTER — Encounter (HOSPITAL_COMMUNITY): Admission: RE | Payer: Self-pay | Source: Ambulatory Visit

## 2014-07-27 ENCOUNTER — Ambulatory Visit (HOSPITAL_COMMUNITY)
Admission: RE | Admit: 2014-07-27 | Payer: BC Managed Care – PPO | Source: Ambulatory Visit | Admitting: Internal Medicine

## 2014-07-27 SURGERY — COLONOSCOPY
Anesthesia: Moderate Sedation

## 2014-08-08 ENCOUNTER — Ambulatory Visit (AMBULATORY_SURGERY_CENTER): Payer: Self-pay | Admitting: *Deleted

## 2014-08-08 VITALS — Ht 60.75 in | Wt 167.0 lb

## 2014-08-08 DIAGNOSIS — Z1211 Encounter for screening for malignant neoplasm of colon: Secondary | ICD-10-CM

## 2014-08-08 MED ORDER — MOVIPREP 100 G PO SOLR: ORAL | Status: DC

## 2014-08-08 NOTE — Progress Notes (Signed)
Patient denies any allergies to eggs or soy. Patient denies any problems with anesthesia/sedation. Patient denies any oxygen use at home and does not take any diet/weight loss medications. Patient does not want EMMI information.

## 2014-08-15 ENCOUNTER — Encounter: Payer: Self-pay | Admitting: Gastroenterology

## 2014-08-22 ENCOUNTER — Encounter: Payer: Self-pay | Admitting: Gastroenterology

## 2014-08-22 ENCOUNTER — Ambulatory Visit (AMBULATORY_SURGERY_CENTER): Payer: BC Managed Care – PPO | Admitting: Gastroenterology

## 2014-08-22 VITALS — BP 99/70 | HR 50 | Temp 96.5°F | Resp 10 | Ht 63.0 in | Wt 167.0 lb

## 2014-08-22 DIAGNOSIS — D123 Benign neoplasm of transverse colon: Secondary | ICD-10-CM | POA: Diagnosis not present

## 2014-08-22 DIAGNOSIS — K635 Polyp of colon: Secondary | ICD-10-CM

## 2014-08-22 DIAGNOSIS — Z1211 Encounter for screening for malignant neoplasm of colon: Secondary | ICD-10-CM

## 2014-08-22 MED ORDER — SODIUM CHLORIDE 0.9 % IV SOLN: 500.0000 mL | INTRAVENOUS | Status: DC

## 2014-08-22 NOTE — Patient Instructions (Signed)
YOU HAD AN ENDOSCOPIC PROCEDURE TODAY AT THE [facility]:   Refer to the procedure report that was given to you for any specific questions about what was found during the examination.  If the procedure report does not answer your questions, please call your gastroenterologist to clarify.  If you requested that your care partner not be given the details of your procedure findings, then the procedure report has been included in a sealed envelope for you to review at your convenience later.  YOU SHOULD EXPECT: Some feelings of bloating in the abdomen. Passage of more gas than usual.  Walking can help get rid of the air that was put into your GI tract during the procedure and reduce the bloating. If you had a lower endoscopy (such as a colonoscopy or flexible sigmoidoscopy) you may notice spotting of blood in your stool or on the toilet paper. If you underwent a bowel prep for your procedure, you may not have a normal bowel movement for a few days.  Please Note:  You might notice some irritation and congestion in your nose or some drainage.  This is from the oxygen used during your procedure.  There is no need for concern and it should clear up in a day or so.  SYMPTOMS TO REPORT IMMEDIATELY:   Following lower endoscopy (colonoscopy or flexible sigmoidoscopy):  Excessive amounts of blood in the stool  Significant tenderness or worsening of abdominal pains  Swelling of the abdomen that is new, acute  Fever of 100F or higher    For urgent or emergent issues, a gastroenterologist can be reached at any hour by calling [phone].   DIET: Your first meal following the procedure should be a small meal and then it is ok to progress to your normal diet. Heavy or fried foods are harder to digest and may make you feel nauseous or bloated.  Likewise, meals heavy in dairy and vegetables can increase bloating.  Drink plenty of fluids but you should avoid alcoholic beverages for 24  hours.  ACTIVITY:  You should plan to take it easy for the rest of today and you should NOT DRIVE or use heavy machinery until tomorrow (because of the sedation medicines used during the test).    FOLLOW UP: Our staff will call the number listed on your records the next business day following your procedure to check on you and address any questions or concerns that you may have regarding the information given to you following your procedure. If we do not reach you, we will leave a message.  However, if you are feeling well and you are not experiencing any problems, there is no need to return our call.  We will assume that you have returned to your regular daily activities without incident.  If any biopsies were taken you will be contacted by phone or by letter within the next 1-3 weeks.  Please call us at [phone] if you have not heard about the biopsies in 3 weeks.    SIGNATURES/CONFIDENTIALITY: You and/or your care partner have signed paperwork which will be entered into your electronic medical record.  These signatures attest to the fact that that the information above on your After Visit Summary has been reviewed and is understood.  Full responsibility of the confidentiality of this discharge information lies with you and/or your care-partner.   Information on polyps given to you today 

## 2014-08-22 NOTE — Progress Notes (Signed)
A/ox3, pleased with MAC, report to RN 

## 2014-08-22 NOTE — Progress Notes (Signed)
Called to room to assist during endoscopic procedure.  Patient ID and intended procedure confirmed with present staff. Received instructions for my participation in the procedure from the performing physician.  

## 2014-08-22 NOTE — Op Note (Signed)
[address] [place], [id number]   COLONOSCOPY PROCEDURE REPORT  PATIENT: Faith Montgomery, Faith Montgomery  MR#: [id number] BIRTHDATE: Jul 21, 1955 , 58  yrs. old GENDER: female ENDOSCOPIST: Ladene Artist, MD, [facility] REFERRED QQ:[id number], Cletus Gash PROCEDURE DATE:  08/22/2014 PROCEDURE:   Colonoscopy, screening and Colonoscopy with snare polypectomy First Screening Colonoscopy - Avg.  risk and is 50 yrs.  old or older Yes.  Prior Negative Screening - Now for repeat screening. N/A  History of Adenoma - Now for follow-up colonoscopy & has been > or = to 3 yrs.  N/A ASA CLASS:   Class II INDICATIONS:Screening for colonic neoplasia and Colorectal Neoplasm Risk Assessment for this procedure is average risk. MEDICATIONS: Monitored anesthesia care and Propofol 250 mg IV DESCRIPTION OF PROCEDURE:   After the risks benefits and alternatives of the procedure were thoroughly explained, informed consent was obtained.  The digital rectal exam revealed no abnormalities of the rectum.   The LB [id number]-[id number]  endoscope was introduced through the anus and advanced to the cecum, which was identified by both the appendix and ileocecal valve. No adverse events experienced.   The quality of the prep was excellent. (MoviPrep was used)  The instrument was then slowly withdrawn as the colon was fully examined.    COLON FINDINGS: Two sessile polyps measuring 5 mm in size were found in the transverse colon.  Polypectomies were performed with a cold snare.  The resection was complete, the polyp tissue was partially retrieved and sent to histology. One retrieved, one not retieved. The examination was otherwise normal.  Retroflexed views revealed no abnormalities. The time to cecum = 1.0 Withdrawal time = 13.1 The scope was withdrawn and the procedure completed. COMPLICATIONS: There were no immediate complications.  ENDOSCOPIC IMPRESSION: 1.   Two sessile polyps in the transverse colon;  polypectomies performed with a cold snare 2.   The examination was otherwise normal  RECOMMENDATIONS: 1.  Await pathology results 2.  Repeat colonoscopy in 5 years if polyp adenomatous; otherwise 10 years  eSigned:  Ladene Artist, MD, [facility] 08/22/2014 9:18 AM

## 2014-08-23 ENCOUNTER — Telehealth: Payer: Self-pay

## 2014-08-23 NOTE — Telephone Encounter (Signed)
Left message on answering machine. 

## 2014-08-26 ENCOUNTER — Encounter: Payer: Self-pay | Admitting: Gastroenterology

## 2014-10-03 ENCOUNTER — Ambulatory Visit: Payer: BC Managed Care – PPO | Admitting: Physician Assistant

## 2014-10-05 ENCOUNTER — Encounter: Payer: Self-pay | Admitting: Physician Assistant

## 2014-10-05 ENCOUNTER — Other Ambulatory Visit: Payer: Self-pay | Admitting: Family Medicine

## 2014-10-05 ENCOUNTER — Ambulatory Visit (INDEPENDENT_AMBULATORY_CARE_PROVIDER_SITE_OTHER): Payer: BC Managed Care – PPO | Admitting: Physician Assistant

## 2014-10-05 VITALS — BP 136/86 | HR 68 | Temp 98.1°F | Resp 18 | Wt 168.0 lb

## 2014-10-05 DIAGNOSIS — I10 Essential (primary) hypertension: Secondary | ICD-10-CM

## 2014-10-05 MED ORDER — LOSARTAN POTASSIUM 50 MG PO TABS: 50.0000 mg | ORAL_TABLET | Freq: Every day | ORAL | Status: DC

## 2014-10-05 NOTE — Progress Notes (Signed)
Patient ID: KHADEEJA ELDEN MRN: [id number], DOB: September 06, 1955, 59 y.o. Date of Encounter: 10/05/2014,   Chief Complaint: Routine OV, F/U HTN  HPI: 59 y.o. y/o white female  here for routine OV to f/u HTN.  Her last visit was with me 03/28/14 for CPE.   She has no complaints and has been feeling good.  She is taking her blood pressure medication as directed. No adverse effects. No lightheadedness.  Today she does say that she sometimes has some pain in her knee. This mostly occurs after she has been sitting a long time and then when she first gets moving. Says that in the past she was told she had arthritis in her knees. Is asking if it's okay if she takes Advil occasionally. Says that on average she only needs one about every 3 days.  Also today and noticed that it seemed that she was having difficulty hearing me unless she could see mild lips and I could tell that she was trying to read my lips. Asked her about this. 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  Review of Systems: Consitutional: No fever, chills, fatigue, night sweats, lymphadenopathy. No significant/unexplained weight changes. Eyes: No visual changes, eye redness, or discharge. ENT/Mouth: No ear pain, sore throat, nasal drainage, or sinus pain. Cardiovascular: No chest pressure,heaviness, tightness or squeezing, even with exertion. No increased shortness of breath or dyspnea on exertion.No palpitations, edema, orthopnea, PND. Respiratory: No cough, hemoptysis, SOB, or wheezing. Gastrointestinal: No anorexia, dysphagia, reflux, pain, nausea, vomiting, hematemesis, diarrhea, constipation, BRBPR, or melena. Breast: No mass, nodules, bulging, or retraction. No skin changes or  inflammation. No nipple discharge. No lymphadenopathy. Genitourinary: No dysuria, hematuria, incontinence, vaginal discharge, pruritis, burning, abnormal bleeding, or pain. Musculoskeletal: No decreased ROM, No joint pain or swelling. No significant pain in neck, back, or extremities. Skin: No rash, pruritis, or concerning lesions. Neurological: No headache, dizziness, syncope, seizures, tremors, memory loss, coordination problems, or paresthesias. Psychological: No anxiety, depression, hallucinations, SI/HI. Endocrine: No polydipsia, polyphagia, polyuria, or known diabetes.No increased fatigue. No palpitations/rapid heart rate. No significant/unexplained weight change. All other systems were reviewed and are otherwise negative.  Past Medical History  Diagnosis Date  . Hypertension      Past Surgical History  Procedure Laterality Date  . Knee arthroscopy      both knees at different times  . Hammer toe surgery Bilateral     Home Meds:  Outpatient Prescriptions Prior to Visit  Medication Sig Dispense Refill  . losartan (COZAAR) 50 MG tablet Take 1 tablet (50 mg total) by mouth daily.  90 tablet  3  .      .      .         No facility-administered medications prior to visit.     Allergies:  Allergies  Allergen Reactions  . Benazepril     Cough---09/2013    History   Social History  . Marital Status: Married    Spouse Name: N/A  . Number of Children: N/A  . Years of Education: N/A   Occupational History  . School [employer]   . Cleans Houses    Social History Main Topics  . Smoking status: Never Smoker   . Smokeless tobacco: Never Used  .  Alcohol Use: No  . Drug Use: No  . Sexual Activity: Yes    Birth Control/ Protection: Post-menopausal   Other Topics Concern  . Not on file   Social History Narrative   Married. Lives with husband.    Entered 2015--Daughter and Granddaughter live with them now.    3 children. 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      Walks with husband every night for 30 minutes.( This was in 2014)   In 2015--pt says they walk sometimes.    She is active with granddaughter even on days they do not walk.    Family History  Problem Relation Age of Onset  . Heart disease Father   . Diabetes Father   . Stroke Brother 50    stroke  . Colon cancer Neg Hx     Physical Exam: Blood pressure 136/86, pulse 68, temperature 98.1 F (36.7 C), temperature source Oral, resp. rate 18, weight 168 lb (76.204 kg)., Body mass index is 29.77 kg/(m^2). General: Well developed, well nourished,WF. Appears in no acute distress. Neck: Supple. Trachea midline. No thyromegaly. Full ROM. No lymphadenopathy.No Carotid Bruits. Lungs: Clear to auscultation bilaterally without wheezes, rales, or rhonchi. Breathing is of normal effort and unlabored. Cardiovascular: RRR with S1 S2. No murmurs, rubs, or gallops. Distal pulses 2+ symmetrically. No carotid or abdominal bruits. Abdomen: Soft, non-tender, non-distended with normoactive bowel sounds. No hepatosplenomegaly or masses. No rebound/guarding. No CVA tenderness. No hernias.  Musculoskeletal: Full range of motion and 5/5 strength throughout. Skin: Warm and moist without erythema, ecchymosis, wounds, or rash. Neuro: A+Ox3. CN II-XII grossly intact. Moves all extremities spontaneously. Full sensation throughout. Normal gait. Psych:  Responds to questions appropriately with a normal affect.   Assessment/Plan:   59 y.o. y/o female here for  1. Essential hypertension Blood Pressure is at goal with current medication. Continue current treatment. Check lab to monitor. - BASIC METABOLIC PANEL WITH GFR  2. OA of Knees--- reviewed that CMET was normal 10/15.  Told her it is okay to use over-the-counter NSAIDs with food as needed as long as this is sparingly as it is currently.    THE  FOLLOWING IS COPIED FROM HER CPE---03/28/2014: 1. Visit for preventive health examination  A.Screening labs: She came fasting and had these done 03/22/2014 All are normal. This includes CBC, CMET, FLP,  TSH, vitamin D.  B. Pap Smear: She had Pap smear with me 03/16/12. This was negative for intraepithelial lesion or malignancy. That Pap smear was cytology alone so she will need a repeat in 3 years.  C. Screening mammogram: Last mammogram 04/20/2014--in Epic  D. colorectal cancer screening: We discussed this in 2013 and I did referral to GI. She did go to the appointment with GI. However at her f/u OV with me 03/2013  she said that her insurance coverage would not cover the colonoscopy. Therefore she did not have the procedure done. However, at her CPE 03/2013 she said she had been told that her insurance coverage had been changed.  She was going to contact the insurance company to verify. If colonoscopy is covered and she will followup with  having this performed. However, today--03/2014--she says that she forgot to check regarding the insurance coverage and never did anything about this. AT CPE-- 03/2014-- on her AVS --I wrote down for her to check with her insurance regarding coverage for colonoscopy. If current insurance will pay for colonoscopy, then she will call me and I will schedule referral. Last year her 03/2013 had said that if it was not covered and she needed to return here for Hemoccult cards. However she forgot about the whole issue and never followed up with either thinning. Today I have gone ahead and given her the Hemoccult cards while she is here she is to do these as well as find out whether insurance will pay for the colonoscopy or not.  AT OV 10/05/2014--I reviewed that she had colonoscopy 08/22/2014  E. Immunizations:  Influenza vaccine--Given here 03/28/2014 Tetanus vaccine was given here 03/25/2012 as Tdap. Discuss Zostavax at age 49 Pneumonia Vaccines  at age 31.  2.  Hypertension Blood Pressure is at goal. Continue current medication. Lab is normal.    Signed, [address] [place], [place], [facility] 10/05/2014 3:49 PM

## 2014-10-06 LAB — BASIC METABOLIC PANEL WITH GFR
BUN: 17 mg/dL (ref 6–23)
CALCIUM: 9.1 mg/dL (ref 8.4–10.5)
CO2: 25 mEq/L (ref 19–32)
CREATININE: 0.76 mg/dL (ref 0.50–1.10)
Chloride: 104 mEq/L (ref 96–112)
GFR, EST NON AFRICAN AMERICAN: 87 mL/min
Glucose, Bld: 95 mg/dL (ref 70–99)
POTASSIUM: 3.6 meq/L (ref 3.5–5.3)
Sodium: 138 mEq/L (ref 135–145)

## 2014-11-08 ENCOUNTER — Encounter: Payer: Self-pay | Admitting: Family Medicine

## 2015-03-06 ENCOUNTER — Other Ambulatory Visit: Payer: Self-pay | Admitting: Obstetrics and Gynecology

## 2015-03-07 LAB — CYTOLOGY - PAP

## 2015-03-20 ENCOUNTER — Other Ambulatory Visit: Payer: Self-pay | Admitting: Physician Assistant

## 2015-03-20 DIAGNOSIS — Z1231 Encounter for screening mammogram for malignant neoplasm of breast: Secondary | ICD-10-CM

## 2015-04-05 ENCOUNTER — Ambulatory Visit (INDEPENDENT_AMBULATORY_CARE_PROVIDER_SITE_OTHER): Payer: BC Managed Care – PPO | Admitting: Physician Assistant

## 2015-04-05 ENCOUNTER — Encounter: Payer: Self-pay | Admitting: Physician Assistant

## 2015-04-05 VITALS — BP 104/76 | HR 68 | Temp 98.0°F | Resp 18 | Ht 61.25 in | Wt 162.0 lb

## 2015-04-05 DIAGNOSIS — I10 Essential (primary) hypertension: Secondary | ICD-10-CM

## 2015-04-05 DIAGNOSIS — Z23 Encounter for immunization: Secondary | ICD-10-CM

## 2015-04-05 DIAGNOSIS — Z0000 Encounter for general adult medical examination without abnormal findings: Secondary | ICD-10-CM | POA: Diagnosis not present

## 2015-04-05 NOTE — Progress Notes (Signed)
Patient ID: Faith Montgomery MRN: [id number], DOB: 02-22-1956, 59 y.o. Date of Encounter: 04/05/2015,   Chief Complaint: Physical (CPE)  HPI: 59 y.o. y/o white female  here for CPE.   She has no complaints and has been feeling good.  She is taking her blood pressure medication as directed. No adverse effects. No lightheadedness.   Review of Systems: Consitutional: No fever, chills, fatigue, night sweats, lymphadenopathy. No significant/unexplained weight changes. Eyes: No visual changes, eye redness, or discharge. ENT/Mouth: No ear pain, sore throat, nasal drainage, or sinus pain. Cardiovascular: No chest pressure,heaviness, tightness or squeezing, even with exertion. No increased shortness of breath or dyspnea on exertion.No palpitations, edema, orthopnea, PND. Respiratory: No cough, hemoptysis, SOB, or wheezing. Gastrointestinal: No anorexia, dysphagia, reflux, pain, nausea, vomiting, hematemesis, diarrhea, constipation, BRBPR, or melena. Breast: No mass, nodules, bulging, or retraction. No skin changes or inflammation. No nipple discharge. No lymphadenopathy. Genitourinary: No dysuria, hematuria, incontinence, vaginal discharge, pruritis, burning, abnormal bleeding, or pain. Musculoskeletal: No decreased ROM, No joint pain or swelling. No significant pain in neck, back, or extremities. Skin: No rash, pruritis, or concerning lesions. Neurological: No headache, dizziness, syncope, seizures, tremors, memory loss, coordination problems, or paresthesias. Psychological: No anxiety, depression, hallucinations, SI/HI. Endocrine: No polydipsia, polyphagia, polyuria, or known diabetes.No increased fatigue. No palpitations/rapid heart rate. No significant/unexplained weight change. All other systems were reviewed and are otherwise negative.  Past Medical History  Diagnosis Date  . Hypertension      Past Surgical History  Procedure Laterality Date  . Knee arthroscopy      both knees at  different times  . Hammer toe surgery Bilateral     Home Meds:  Outpatient Prescriptions Prior to Visit  Medication Sig Dispense Refill  . losartan (COZAAR) 50 MG tablet Take 1 tablet (50 mg total) by mouth daily.  90 tablet  3  .      .      .         No facility-administered medications prior to visit.     Allergies:  Allergies  Allergen Reactions  . Benazepril     Cough---09/2013    Social History   Social History  . Marital Status: Married    Spouse Name: N/A  . Number of Children: N/A  . Years of Education: N/A   Occupational History  . School [employer]   . Cleans Houses    Social History Main Topics  . Smoking status: Never Smoker   . Smokeless tobacco: Never Used  . Alcohol Use: No  . Drug Use: No  . Sexual Activity: Yes    Birth Control/ Protection: Post-menopausal   Other Topics Concern  . Not on file   Social History Narrative   Married. Lives with husband.    Entered 2015--Daughter and Granddaughter live with them now.    3 children. 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      Walks with husband every night for 30 minutes.( This was in 2014)   In 2015--pt says they walk sometimes.    She is active with granddaughter even on days they do not walk.  03/2015--- says  that her daughter and granddaughter are noted longer living with her. Says that she is back to walking routinely.  Family History  Problem Relation Age of Onset  . Heart disease Father   . Diabetes Father   . Stroke Brother 50    stroke  . Colon cancer Neg Hx     Physical Exam: Blood pressure 104/76, pulse 68, temperature 98 F (36.7 C), temperature source Oral, resp. rate 18, height 5' 1.25" (1.556 m), weight 162 lb (73.483 kg)., Body mass index is 30.35 kg/(m^2). General: Well developed, well nourished,WF. Appears in no acute distress. HEENT: Normocephalic,  atraumatic. Conjunctiva pink, sclera non-icteric. Pupils 2 mm constricting to 1 mm, round, regular, and equally reactive to light and accomodation. EOMI. Internal auditory canal clear. TMs with good cone of light and without pathology. Nasal mucosa pink. Nares are without discharge. No sinus tenderness. Oral mucosa pink.  Pharynx without exudate.   Neck: Supple. Trachea midline. No thyromegaly. Full ROM. No lymphadenopathy.No Carotid Bruits. Lungs: Clear to auscultation bilaterally without wheezes, rales, or rhonchi. Breathing is of normal effort and unlabored. Cardiovascular: RRR with S1 S2. No murmurs, rubs, or gallops. Distal pulses 2+ symmetrically. No carotid or abdominal bruits. Breast: Symmetrical. No masses. Nipples without discharge. Abdomen: Soft, non-tender, non-distended with normoactive bowel sounds. No hepatosplenomegaly or masses. No rebound/guarding. No CVA tenderness. No hernias.  Genitourinary:  External genitalia without lesions. Vaginal mucosa pink.No discharge present. Cervix pink and without discharge. No cervical tenderness.Normal uterus size. No adnexal mass or tenderness. Pap smear sent.   Musculoskeletal: Full range of motion and 5/5 strength throughout. Skin: Warm and moist without erythema, ecchymosis, wounds, or rash. Neuro: A+Ox3. CN II-XII grossly intact. Moves all extremities spontaneously. Full sensation throughout. Normal gait. DTR 2+ throughout upper and lower extremities. Psych:  Responds to questions appropriately with a normal affect.   Assessment/Plan:  59 y.o. y/o female here for CPE 1. Visit for preventive health examination  A.Screening labs:  03/22/2014---Labs were completely normal and FLP was excellent. (Included CBC, CMET, FLP,  TSH, vitamin D) She is not fasting today. She is feeling good and no symptoms to suggest change in labs so she is agreeable to hold off on repeating full panel. Just check a BM ETT today to monitor her blood pressure  medication.  B. Pap Smear: She had Pap smear with me 03/16/12. This was negative for intraepithelial lesion or malignancy. That Pap smear was cytology alone so she will need a repeat in 3 years. Repeat Pap smear with cytology and HPV co-testing 04/05/15  C. Screening mammogram: She has been having annual routine mammograms. She states that she has a mammogram scheduled for 04/24/2015.  Can wait until closer to age 62 to start doing bone density scans.  D. colorectal cancer screening: She had a colonoscopy March 2016. She states that it did show 2 polyps. However she says that she did end up having to pay some towards that procedure and is still making payments for this.  E. Immunizations:  She is agreeable to have influenza vaccine today. Tetanus vaccine was given here 03/25/2012 as Tdap. Discuss Zostavax at age 87 Pneumonia Vaccines  at age 43.  2. Hypertension Blood Pressure is at goal. Continue current medication. Lab is normal.   4. Need for prophylactic vaccination and inoculation against influenza - Flu Vaccine QUAD 36+ mos PF IM (Fluarix Quad PF)   Signed, [address] [place], [place], [facility] 04/05/2015 4:11 PM

## 2015-04-06 ENCOUNTER — Ambulatory Visit: Payer: BC Managed Care – PPO | Admitting: Physician Assistant

## 2015-04-06 LAB — BASIC METABOLIC PANEL WITH GFR
BUN: 19 mg/dL (ref 7–25)
CALCIUM: 8.8 mg/dL (ref 8.6–10.4)
CHLORIDE: 105 mmol/L (ref 98–110)
CO2: 28 mmol/L (ref 20–31)
CREATININE: 0.72 mg/dL (ref 0.50–1.05)
GFR, Est African American: >89 mL/min (ref >=60)
GFR, Est Non African American: >89 mL/min (ref >=60)
Glucose, Bld: 77 mg/dL (ref 70–99)
Potassium: 3.9 mmol/L (ref 3.5–5.3)
SODIUM: 140 mmol/L (ref 135–146)

## 2015-04-07 ENCOUNTER — Encounter: Payer: Self-pay | Admitting: Family Medicine

## 2015-04-07 LAB — PAP, THIN PREP W/HPV RFLX HPV TYPE 16/18: HPV DNA HIGH RISK: NOT DETECTED

## 2015-04-24 ENCOUNTER — Ambulatory Visit (HOSPITAL_COMMUNITY)
Admission: RE | Admit: 2015-04-24 | Discharge: 2015-04-24 | Disposition: A | Discharge status: Home / Self Care | Payer: BC Managed Care – PPO | Source: Ambulatory Visit | Attending: Physician Assistant | Admitting: Physician Assistant

## 2015-04-24 DIAGNOSIS — Z1231 Encounter for screening mammogram for malignant neoplasm of breast: Secondary | ICD-10-CM | POA: Diagnosis present

## 2015-09-27 ENCOUNTER — Other Ambulatory Visit: Payer: Self-pay | Admitting: Family Medicine

## 2015-09-27 NOTE — Telephone Encounter (Signed)
Refill appropriate and filled per protocol. 

## 2015-10-04 ENCOUNTER — Encounter: Payer: Self-pay | Admitting: Physician Assistant

## 2015-10-04 ENCOUNTER — Ambulatory Visit (INDEPENDENT_AMBULATORY_CARE_PROVIDER_SITE_OTHER): Payer: BC Managed Care – PPO | Admitting: Physician Assistant

## 2015-10-04 VITALS — BP 122/80 | HR 88 | Temp 98.2°F | Resp 18 | Ht 61.0 in | Wt 161.0 lb

## 2015-10-04 DIAGNOSIS — I10 Essential (primary) hypertension: Secondary | ICD-10-CM

## 2015-10-04 NOTE — Progress Notes (Signed)
    Patient ID: TARAE HUX MRN: [id number], DOB: 07-04-1955, 60 y.o. Date of Encounter: 10/04/2015, 4:19 PM    Chief Complaint:  Chief Complaint  Patient presents with  . 6 mth check up     HPI: 60 y.o. year old white female since for 6 month follow-up check up for her blood pressure.  She is taking her losartan daily as directed. No adverse effects. States that she does not check her blood pressure any between visits here. She has no complaints or concerns today.     Home Meds:   Outpatient Prescriptions Prior to Visit  Medication Sig Dispense Refill  . losartan (COZAAR) 50 MG tablet TAKE 1 TABLET EVERY DAY 90 tablet 0   No facility-administered medications prior to visit.    Allergies:  Allergies  Allergen Reactions  . Benazepril     Cough---09/2013      Review of Systems: See HPI for pertinent ROS. All other ROS negative.    Physical Exam: Blood pressure 122/80, pulse 88, temperature 98.2 F (36.8 C), temperature source Oral, resp. rate 18, height 5\' 1"  (1.549 m), weight 161 lb (73.029 kg)., Body mass index is 30.44 kg/(m^2). General:  WNWD WF. Appears in no acute distress. Neck: Supple. No thyromegaly. No lymphadenopathy. Lungs: Clear bilaterally to auscultation without wheezes, rales, or rhonchi. Breathing is unlabored. Heart: Regular rhythm. No murmurs, rubs, or gallops. Msk:  Strength and tone normal for age. Extremities/Skin: Warm and dry.  No edema. Neuro: Alert and oriented X 3. Moves all extremities spontaneously. Gait is normal. CNII-XII grossly in tact. Psych:  Responds to questions appropriately with a normal affect.     ASSESSMENT AND PLAN:  60 y.o. year old female with  1. Essential hypertension Blood pressure is controlled/at goal. Continue current medication. Check lab to monitor. - BASIC METABOLIC PANEL WITH GFR  She had complete physical exam with me October 2016. Preventive Care up to date.  Marin Olp [place], [place],  [facility] 10/04/2015 4:19 PM

## 2015-10-05 LAB — BASIC METABOLIC PANEL WITH GFR
BUN: 26 mg/dL — AB (ref 7–25)
CALCIUM: 9.3 mg/dL (ref 8.6–10.4)
CO2: 22 mmol/L (ref 20–31)
Chloride: 105 mmol/L (ref 98–110)
Creat: 0.67 mg/dL (ref 0.50–1.05)
GFR, Est Non African American: >89 mL/min (ref >=60)
GLUCOSE: 79 mg/dL (ref 70–99)
Potassium: 4 mmol/L (ref 3.5–5.3)
Sodium: 138 mmol/L (ref 135–146)

## 2015-10-10 ENCOUNTER — Encounter: Payer: Self-pay | Admitting: Family Medicine

## 2015-12-26 ENCOUNTER — Other Ambulatory Visit: Payer: Self-pay | Admitting: Family Medicine

## 2016-03-26 ENCOUNTER — Other Ambulatory Visit: Payer: Self-pay | Admitting: Physician Assistant

## 2016-04-08 ENCOUNTER — Other Ambulatory Visit: Payer: Self-pay | Admitting: Physician Assistant

## 2016-04-08 DIAGNOSIS — Z1231 Encounter for screening mammogram for malignant neoplasm of breast: Secondary | ICD-10-CM

## 2016-04-10 ENCOUNTER — Encounter: Payer: Self-pay | Admitting: Physician Assistant

## 2016-04-10 ENCOUNTER — Ambulatory Visit (INDEPENDENT_AMBULATORY_CARE_PROVIDER_SITE_OTHER): Payer: BC Managed Care – PPO | Admitting: Physician Assistant

## 2016-04-10 VITALS — BP 108/64 | HR 93 | Temp 97.9°F | Resp 18 | Ht 61.0 in | Wt 152.0 lb

## 2016-04-10 DIAGNOSIS — Z23 Encounter for immunization: Secondary | ICD-10-CM | POA: Diagnosis not present

## 2016-04-10 DIAGNOSIS — I10 Essential (primary) hypertension: Secondary | ICD-10-CM | POA: Diagnosis not present

## 2016-04-10 DIAGNOSIS — Z0000 Encounter for general adult medical examination without abnormal findings: Secondary | ICD-10-CM | POA: Diagnosis not present

## 2016-04-10 NOTE — Progress Notes (Signed)
Patient ID: DENALY OSUCH MRN: [id number], DOB: September 14, 1955, 60 y.o. Date of Encounter: 04/10/2016,   Chief Complaint: Physical (CPE)  HPI: 60 y.o. y/o white female  here for CPE.   She has no complaints and has been feeling good.  She is taking her blood pressure medication as directed. No adverse effects. No lightheadedness.   Review of Systems: Consitutional: No fever, chills, fatigue, night sweats, lymphadenopathy. No significant/unexplained weight changes. Eyes: No visual changes, eye redness, or discharge. ENT/Mouth: No ear pain, sore throat, nasal drainage, or sinus pain. Cardiovascular: No chest pressure,heaviness, tightness or squeezing, even with exertion. No increased shortness of breath or dyspnea on exertion.No palpitations, edema, orthopnea, PND. Respiratory: No cough, hemoptysis, SOB, or wheezing. Gastrointestinal: No anorexia, dysphagia, reflux, pain, nausea, vomiting, hematemesis, diarrhea, constipation, BRBPR, or melena. Breast: No mass, nodules, bulging, or retraction. No skin changes or inflammation. No nipple discharge. No lymphadenopathy. Genitourinary: No dysuria, hematuria, incontinence, vaginal discharge, pruritis, burning, abnormal bleeding, or pain. Musculoskeletal: No decreased ROM, No joint pain or swelling. No significant pain in neck, back, or extremities. Skin: No rash, pruritis, or concerning lesions. Neurological: No headache, dizziness, syncope, seizures, tremors, memory loss, coordination problems, or paresthesias. Psychological: No anxiety, depression, hallucinations, SI/HI. Endocrine: No polydipsia, polyphagia, polyuria, or known diabetes.No increased fatigue. No palpitations/rapid heart rate. No significant/unexplained weight change. All other systems were reviewed and are otherwise negative.  Past Medical History:  Diagnosis Date  . Hypertension      Past Surgical History:  Procedure Laterality Date  . HAMMER TOE SURGERY Bilateral   .  KNEE ARTHROSCOPY     both knees at different times    Home Meds:  Outpatient Prescriptions Prior to Visit  Medication Sig Dispense Refill  . losartan (COZAAR) 50 MG tablet Take 1 tablet (50 mg total) by mouth daily.  90 tablet  3  .      .      .         No facility-administered medications prior to visit.     Allergies:  Allergies  Allergen Reactions  . Benazepril     Cough---09/2013    Social History   Social History  . Marital status: Married    Spouse name: N/A  . Number of children: N/A  . Years of education: N/A   Occupational History  . [place]  . Cleans Houses    Social History Main Topics  . Smoking status: Never Smoker  . Smokeless tobacco: Never Used  . Alcohol use No  . Drug use: No  . Sexual activity: Yes    Birth control/ protection: Post-menopausal   Other Topics Concern  . Not on file   Social History Narrative   Married. Lives with husband.    Entered 2015--Daughter and Granddaughter live with them now.    3 children. 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      Walks with husband every night for 30 minutes.( This was in 2014)   In 2015--pt says they walk sometimes.    She is active with granddaughter even on days they do not walk.  03/2015--- says  that her daughter and granddaughter are no longer living with her. Says that she is back to walking routinely.  Family History  Problem Relation Age of Onset  . Heart disease Father   . Diabetes Father   . Stroke Brother 50    stroke  . Colon cancer Neg Hx     Physical Exam: Blood pressure 108/64, pulse 93, temperature 97.9 F (36.6 C), temperature source Oral, resp. rate 18, height 5\' 1"  (1.549 m), weight 152 lb (68.9 kg), SpO2 98 %., Body mass index is 28.72 kg/m. General: Well developed, well nourished,WF. Appears in no acute distress. HEENT:  Normocephalic, atraumatic. Conjunctiva pink, sclera non-icteric. Pupils 2 mm constricting to 1 mm, round, regular, and equally reactive to light and accomodation. EOMI. Internal auditory canal clear. TMs with good cone of light and without pathology. Nasal mucosa pink. Nares are without discharge. No sinus tenderness. Oral mucosa pink.  Pharynx without exudate.   Neck: Supple. Trachea midline. No thyromegaly. Full ROM. No lymphadenopathy.No Carotid Bruits. Lungs: Clear to auscultation bilaterally without wheezes, rales, or rhonchi. Breathing is of normal effort and unlabored. Cardiovascular: RRR with S1 S2. No murmurs, rubs, or gallops. Distal pulses 2+ symmetrically. No carotid or abdominal bruits. Breast: Symmetrical. No masses. Nipples without discharge. Abdomen: Soft, non-tender, non-distended with normoactive bowel sounds. No hepatosplenomegaly or masses. No rebound/guarding. No CVA tenderness. No hernias.  Genitourinary:  External genitalia without lesions. Vaginal mucosa pink.No discharge present. Cervix pink and without discharge. No cervical tenderness.Normal uterus size. No adnexal mass or tenderness.   Musculoskeletal: Full range of motion and 5/5 strength throughout. Skin: Warm and moist without erythema, ecchymosis, wounds, or rash. Neuro: A+Ox3. CN II-XII grossly intact. Moves all extremities spontaneously. Full sensation throughout. Normal gait. DTR 2+ throughout upper and lower extremities. Psych:  Responds to questions appropriately with a normal affect.   Assessment/Plan:  60 y.o. y/o female here for CPE  1. Visit for preventive health examination  A.Screening labs:  04/10/2016---On November 10 school is closed so she is off work and can come fasting for labs on that day. - CBC with Differential/Platelet; Future - COMPLETE METABOLIC PANEL WITH GFR; Future - Lipid panel; Future - TSH; Future - VITAMIN D 25 Hydroxy (Vit-D Deficiency, Fractures); Future  B. Pap Smear: She had  Pap smear with me 03/16/12. This was negative for intraepithelial lesion or malignancy. That Pap smear was cytology alone so she will need a repeat in 3 years. Repeat Pap smear with cytology and HPV co-testing 04/05/15----Negative Cytology./ Negative HPV 04/10/2016---Can wait 3 - 5 years to repeat pap  C. 04/10/2016: Screening mammogram: She has been having annual routine mammograms. She states that she has a mammogram scheduled for 04/19/2016  Can wait until closer to age 5 to start doing bone density scans.  D. colorectal cancer screening: She had a colonoscopy March 2016. She states that it did show 2 polyps. However she says that she did end up having to pay some towards that procedure and is still making payments for this. 04/10/2016--- tried to pull up the pathology report to see when GI said that her next colonoscopy is due but was unable to find that report/note. Should be 3-5 years after March 2016.  E. Immunizations:  04/10/2016--She is agreeable to have influenza vaccine today. Tetanus vaccine was given here 03/25/2012 as Tdap. Discuss Zostavax at age 72 Pneumonia Vaccines  at age 63.  2. Hypertension 04/10/2016---Blood Pressure is at goal. Continue current medication. Will check lab to moniotor on  04/19/2016   Signed, Olean Ree [place], [place], [facility] 04/10/2016 3:17 PM

## 2016-04-10 NOTE — Addendum Note (Signed)
Addended by: Vonna Kotyk A on: 04/10/2016 05:12 PM   Modules accepted: Orders

## 2016-04-19 ENCOUNTER — Other Ambulatory Visit: Payer: BC Managed Care – PPO

## 2016-04-19 DIAGNOSIS — I10 Essential (primary) hypertension: Secondary | ICD-10-CM

## 2016-04-19 DIAGNOSIS — Z0000 Encounter for general adult medical examination without abnormal findings: Secondary | ICD-10-CM

## 2016-04-19 LAB — COMPLETE METABOLIC PANEL WITH GFR
ALT: 9 U/L (ref 6–29)
AST: 13 U/L (ref 10–35)
Albumin: 4 g/dL (ref 3.6–5.1)
Alkaline Phosphatase: 63 U/L (ref 33–130)
BUN: 16 mg/dL (ref 7–25)
CHLORIDE: 107 mmol/L (ref 98–110)
CO2: 23 mmol/L (ref 20–31)
Calcium: 9.1 mg/dL (ref 8.6–10.4)
Creat: 0.61 mg/dL (ref 0.50–1.05)
GFR, Est Non African American: >89 mL/min (ref >=60)
Glucose, Bld: 94 mg/dL (ref 70–99)
POTASSIUM: 4.5 mmol/L (ref 3.5–5.3)
Sodium: 143 mmol/L (ref 135–146)
Total Bilirubin: 0.5 mg/dL (ref 0.2–1.2)
Total Protein: 6.7 g/dL (ref 6.1–8.1)

## 2016-04-19 LAB — LIPID PANEL
CHOL/HDL RATIO: 2.8 ratio (ref <5.0)
Cholesterol: 181 mg/dL (ref <200)
HDL: 65 mg/dL (ref >50)
LDL Cholesterol: 107 mg/dL — ABNORMAL HIGH (ref <100)
TRIGLYCERIDES: 45 mg/dL (ref <150)
VLDL: 9 mg/dL (ref <30)

## 2016-04-19 LAB — TSH: TSH: 1.3 mIU/L

## 2016-04-20 LAB — CBC WITH DIFFERENTIAL/PLATELET
Basophils Absolute: 0 cells/uL (ref 0–200)
Basophils Relative: 0 %
Eosinophils Absolute: 300 cells/uL (ref 15–500)
Eosinophils Relative: 6 %
HEMATOCRIT: 40 % (ref 35.0–45.0)
Hemoglobin: 13.2 g/dL (ref 12.0–15.0)
LYMPHS PCT: 30 %
Lymphs Abs: 1500 cells/uL (ref 850–3900)
MCH: 31.1 pg (ref 27.0–33.0)
MCHC: 33 g/dL (ref 32.0–36.0)
MCV: 94.1 fL (ref 80.0–100.0)
MONO ABS: 400 {cells}/uL (ref 200–950)
MONOS PCT: 8 %
MPV: 10.1 fL (ref 7.5–12.5)
NEUTROS PCT: 56 %
Neutro Abs: 2800 cells/uL (ref 1500–7800)
PLATELETS: 183 10*3/uL (ref 140–400)
RBC: 4.25 MIL/uL (ref 3.80–5.10)
RDW: 13.5 % (ref 11.0–15.0)
WBC: 5 10*3/uL (ref 3.8–10.8)

## 2016-04-20 LAB — VITAMIN D 25 HYDROXY (VIT D DEFICIENCY, FRACTURES): Vit D, 25-Hydroxy: 37 ng/mL (ref 30–100)

## 2016-04-29 ENCOUNTER — Ambulatory Visit (HOSPITAL_COMMUNITY)
Admission: RE | Admit: 2016-04-29 | Discharge: 2016-04-29 | Disposition: A | Discharge status: Home / Self Care | Payer: BC Managed Care – PPO | Source: Ambulatory Visit | Attending: Physician Assistant | Admitting: Physician Assistant

## 2016-04-29 DIAGNOSIS — Z1231 Encounter for screening mammogram for malignant neoplasm of breast: Secondary | ICD-10-CM | POA: Diagnosis not present

## 2016-06-22 ENCOUNTER — Other Ambulatory Visit: Payer: Self-pay | Admitting: Family Medicine

## 2016-09-19 ENCOUNTER — Other Ambulatory Visit: Payer: Self-pay | Admitting: Physician Assistant

## 2016-12-20 ENCOUNTER — Other Ambulatory Visit: Payer: Self-pay | Admitting: Physician Assistant

## 2016-12-20 NOTE — Telephone Encounter (Signed)
Patient due for an office visit. Letter sent via my chart

## 2017-03-20 ENCOUNTER — Other Ambulatory Visit: Payer: Self-pay | Admitting: Physician Assistant

## 2017-03-20 ENCOUNTER — Ambulatory Visit: Payer: BC Managed Care – PPO | Admitting: Family Medicine

## 2017-03-20 NOTE — Telephone Encounter (Signed)
Medication refill for one time only.  Patient needs to be seen.  Letter sent for patient to call and schedule 

## 2017-03-25 ENCOUNTER — Encounter: Payer: Self-pay | Admitting: Family Medicine

## 2017-03-25 ENCOUNTER — Ambulatory Visit (INDEPENDENT_AMBULATORY_CARE_PROVIDER_SITE_OTHER): Payer: BC Managed Care – PPO | Admitting: Family Medicine

## 2017-03-25 VITALS — BP 118/80 | HR 78 | Temp 98.1°F | Resp 16 | Ht 61.0 in | Wt 175.0 lb

## 2017-03-25 DIAGNOSIS — Z23 Encounter for immunization: Secondary | ICD-10-CM

## 2017-03-25 DIAGNOSIS — I10 Essential (primary) hypertension: Secondary | ICD-10-CM

## 2017-03-25 MED ORDER — LOSARTAN POTASSIUM 50 MG PO TABS: 50.0000 mg | ORAL_TABLET | Freq: Every day | ORAL | 11 refills | Status: DC

## 2017-03-25 NOTE — Progress Notes (Signed)
Subjective:    Patient ID: Faith Montgomery, female    DOB: 23-Oct-1955, 61 y.o.   MRN: [id number]  HPI Patient has a history of hypertension. Currently she is taking losartan. She denies any angioedema. She denies any side effect.Blood pressures typically well controlled and much less than 140/90. She is due for mammogram which she usually schedules. Pap smear was performed in 2016 and is not due again until 2019. Her colonoscopy is up-to-date She is due for a flu shot. Otherwise she is doing well. She recently had a cortisone injection in her left knee and an urgent care. Her knee is feeling much better now but she wanted to make [place] aware. The doctor at the urgent care thought she may have torn a meniscus Past Medical History:  Diagnosis Date  . Hypertension    Past Surgical History:  Procedure Laterality Date  . HAMMER TOE SURGERY Bilateral   . KNEE ARTHROSCOPY     both knees at different times   Current Outpatient Prescriptions on File Prior to Visit  Medication Sig Dispense Refill  . losartan (COZAAR) 50 MG tablet Take 1 tablet (50 mg total) by mouth daily. 30 tablet 0   No current facility-administered medications on file prior to visit.    Allergies  Allergen Reactions  . Benazepril     Cough---09/2013   Social History   Social History  . Marital status: Married    Spouse name: N/A  . Number of children: N/A  . Years of education: N/A   Occupational History  . [employer]  . Cleans Houses    Social History Main Topics  . Smoking status: Never Smoker  . Smokeless tobacco: Never Used  . Alcohol use No  . Drug use: No  . Sexual activity: Yes    Birth control/ protection: Post-menopausal   Other Topics Concern  . Not on file   Social History Narrative   Married. Lives with husband.    Entered 2015--Daughter and Granddaughter live with them now.    3 children. 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      Walks with husband every night for 30 minutes.( This was in 2014)   In 2015--pt says they walk sometimes.    She is active with granddaughter even on days they do not walk.      Review of Systems  All other systems reviewed and are negative.      Objective:   Physical Exam  Constitutional: She appears well-developed and well-nourished.  Neck: Neck supple. No JVD present. No thyromegaly present.  Cardiovascular: Normal rate, regular rhythm and normal heart sounds.   Pulmonary/Chest: Effort normal and breath sounds normal. No respiratory distress. She has no wheezes. She has no rales.  Abdominal: Soft. Bowel sounds are normal. She exhibits no distension. There is no tenderness. There is no rebound and no guarding.  Musculoskeletal: She exhibits no edema.  Lymphadenopathy:    She has no cervical adenopathy.  Vitals reviewed.         Assessment & Plan:  Benign essential HTN - Plan: CBC with Differential/Platelet, COMPLETE METABOLIC PANEL  WITH GFR, LDL Cholesterol, Direct  Flu vaccine need - Plan: Flu Vaccine QUAD 36+ mos IM  Blood pressure is well controlled at 118/80. Continue losartan 50 mg by mouth daily. The patient received her flu shot today. I recommended she schedule her mammogram. The remainder of her preventative care is up-to-date including her Pa smear and colonoscopy I will check a CBC, CMP and direct LDL as the patient is not fasting

## 2017-03-26 ENCOUNTER — Other Ambulatory Visit: Payer: Self-pay | Admitting: Physician Assistant

## 2017-03-26 DIAGNOSIS — Z1231 Encounter for screening mammogram for malignant neoplasm of breast: Secondary | ICD-10-CM

## 2017-03-26 LAB — CBC WITH DIFFERENTIAL/PLATELET
Basophils Absolute: 43 cells/uL (ref 0–200)
Basophils Relative: 0.5 %
EOS ABS: 103 {cells}/uL (ref 15–500)
Eosinophils Relative: 1.2 %
HCT: 40.5 % (ref 35.0–45.0)
HEMOGLOBIN: 13.8 g/dL (ref 11.7–15.5)
Lymphs Abs: 1496 cells/uL (ref 850–3900)
MCH: 30.7 pg (ref 27.0–33.0)
MCHC: 34.1 g/dL (ref 32.0–36.0)
MCV: 90 fL (ref 80.0–100.0)
MPV: 10.5 fL (ref 7.5–12.5)
Monocytes Relative: 9.4 %
NEUTROS ABS: 6149 {cells}/uL (ref 1500–7800)
Neutrophils Relative %: 71.5 %
Platelets: 211 10*3/uL (ref 140–400)
RBC: 4.5 10*6/uL (ref 3.80–5.10)
RDW: 12.4 % (ref 11.0–15.0)
Total Lymphocyte: 17.4 %
WBC: 8.6 10*3/uL (ref 3.8–10.8)
WBCMIX: 808 {cells}/uL (ref 200–950)

## 2017-03-26 LAB — COMPLETE METABOLIC PANEL WITH GFR
AG RATIO: 1.7 (calc) (ref 1.0–2.5)
ALBUMIN MSPROF: 4.4 g/dL (ref 3.6–5.1)
ALT: 8 U/L (ref 6–29)
AST: 13 U/L (ref 10–35)
Alkaline phosphatase (APISO): 74 U/L (ref 33–130)
BILIRUBIN TOTAL: 0.5 mg/dL (ref 0.2–1.2)
BUN/Creatinine Ratio: 20 (calc) (ref 6–22)
BUN: 21 mg/dL (ref 7–25)
CALCIUM: 9.5 mg/dL (ref 8.6–10.4)
CHLORIDE: 104 mmol/L (ref 98–110)
CO2: 29 mmol/L (ref 20–32)
CREATININE: 1.05 mg/dL — AB (ref 0.50–0.99)
GFR, Est African American: 67 mL/min/{1.73_m2} (ref >=60)
GFR, Est Non African American: 58 mL/min/{1.73_m2} — ABNORMAL LOW (ref >=60)
GLUCOSE: 85 mg/dL (ref 65–99)
Globulin: 2.6 g/dL (calc) (ref 1.9–3.7)
Potassium: 4.1 mmol/L (ref 3.5–5.3)
Sodium: 140 mmol/L (ref 135–146)
TOTAL PROTEIN: 7 g/dL (ref 6.1–8.1)

## 2017-03-26 LAB — LDL CHOLESTEROL, DIRECT: LDL DIRECT: 84 mg/dL (ref <100)

## 2017-05-07 ENCOUNTER — Ambulatory Visit (HOSPITAL_COMMUNITY)
Admission: RE | Admit: 2017-05-07 | Discharge: 2017-05-07 | Disposition: A | Discharge status: Home / Self Care | Payer: BC Managed Care – PPO | Source: Ambulatory Visit | Attending: Physician Assistant | Admitting: Physician Assistant

## 2017-05-07 DIAGNOSIS — Z1231 Encounter for screening mammogram for malignant neoplasm of breast: Secondary | ICD-10-CM | POA: Diagnosis present

## 2018-01-19 ENCOUNTER — Other Ambulatory Visit: Payer: Self-pay | Admitting: Family Medicine

## 2018-03-09 ENCOUNTER — Encounter: Payer: BC Managed Care – PPO | Admitting: Physician Assistant

## 2018-03-12 ENCOUNTER — Encounter: Payer: Self-pay | Admitting: Physician Assistant

## 2018-03-12 ENCOUNTER — Ambulatory Visit (INDEPENDENT_AMBULATORY_CARE_PROVIDER_SITE_OTHER): Payer: BC Managed Care – PPO | Admitting: Physician Assistant

## 2018-03-12 VITALS — BP 100/60 | HR 88 | Temp 97.9°F | Resp 16 | Ht 61.0 in | Wt 175.0 lb

## 2018-03-12 DIAGNOSIS — Z0000 Encounter for general adult medical examination without abnormal findings: Secondary | ICD-10-CM

## 2018-03-12 DIAGNOSIS — Z0001 Encounter for general adult medical examination with abnormal findings: Secondary | ICD-10-CM | POA: Diagnosis not present

## 2018-03-12 DIAGNOSIS — R202 Paresthesia of skin: Secondary | ICD-10-CM

## 2018-03-12 DIAGNOSIS — I10 Essential (primary) hypertension: Secondary | ICD-10-CM | POA: Diagnosis not present

## 2018-03-12 DIAGNOSIS — Z23 Encounter for immunization: Secondary | ICD-10-CM | POA: Diagnosis not present

## 2018-03-12 NOTE — Progress Notes (Signed)
Patient ID: Faith Montgomery MRN: [id number], DOB: 1956/01/05, 62 y.o. Date of Encounter: 03/12/2018,   Chief Complaint: Physical (CPE)  HPI: 62 y.o. y/o white female  here for CPE.    She is taking her blood pressure medication as directed. No adverse effects. No lightheadedness.  Today she does have one issue that she wants to address. She reports that recently for the past 4 weeks she has been noticing some numbness in her right thumb, index and middle finger.   States that the numbness feels like it is at the fingertips but recently has been extending up the fingers some.   Has noticed this for about 4 weeks. She reports that she is noticing no discomfort in her wrist region.   Has been feeling no discomfort in her neck or down her arms. Says that at first she "thought that she had just slept wrong" but then realized that she was feeling the symptoms at different times and throughout the day.  For her work: She works at [employer]  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  Says that one afternoon during the week she goes and cleans one house and then also cleans another house on Saturdays.  She has no other specific concerns to address. Reports that she has had no other medical updates over the past year.  Otherwise things have been feeling stable with no other specific concerns.   Review of Systems: Consitutional: No fever, chills, fatigue, night sweats, lymphadenopathy. No significant/unexplained weight changes. Eyes: No visual changes, eye redness, or discharge. ENT/Mouth: No ear pain, sore throat, nasal drainage, or sinus pain. Cardiovascular: No chest pressure,heaviness, tightness or squeezing, even with exertion. No increased shortness of breath or dyspnea on exertion.No palpitations,  edema, orthopnea, PND. Respiratory: No cough, hemoptysis, SOB, or wheezing. Gastrointestinal: No anorexia, dysphagia, reflux, pain, nausea, vomiting, hematemesis, diarrhea, constipation, BRBPR, or melena. Breast: No mass, nodules, bulging, or retraction. No skin changes or inflammation. No nipple discharge. No lymphadenopathy. Genitourinary: No dysuria, hematuria, incontinence, vaginal discharge, pruritis, burning, abnormal bleeding, or pain. Musculoskeletal: No decreased ROM, No joint pain or swelling. No significant pain in neck, back, or extremities. SEE HPI Skin: No rash, pruritis, or concerning lesions. Neurological: No headache, dizziness, syncope, seizures, tremors, memory loss, coordination problems.SEE HPI Psychological: No anxiety, depression, hallucinations, SI/HI. Endocrine: No polydipsia, polyphagia, polyuria, or known diabetes.No increased fatigue. No palpitations/rapid heart rate. No significant/unexplained weight change. All other systems were reviewed and are otherwise negative.  Past Medical History:  Diagnosis Date  . Hypertension      Past Surgical History:  Procedure Laterality Date  . HAMMER TOE SURGERY Bilateral   . KNEE ARTHROSCOPY     both knees at different times    Home Meds:  Outpatient Prescriptions Prior to Visit  Medication Sig Dispense Refill  . losartan (COZAAR) 50 MG tablet Take 1 tablet (50 mg total) by mouth daily.  90 tablet  3   No facility-administered medications prior to visit.     Allergies:  Allergies  Allergen Reactions  . Benazepril     Cough---09/2013    Social History   Socioeconomic History  . Marital status: Married    Spouse name: Not on file  . Number of children: Not on file  . Years of  education: Not on file  . Highest education level: Not on file  Occupational History  . 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 Occupation: Teacher, English as a foreign language  Social Needs  . Financial resource strain: Not on  file  . Food insecurity:    Worry: Not on file    Inability: Not on file  . Transportation needs:    Medical: Not on file    Non-medical: Not on file  Tobacco Use  . Smoking status: Never Smoker  . Smokeless tobacco: Never Used  Substance and Sexual Activity  . Alcohol use: No  . Drug use: No  . Sexual activity: Yes    Birth control/protection: Post-menopausal  Lifestyle  . Physical activity:    Days per week: Not on file    Minutes per session: Not on file  . Stress: Not on file  Relationships  . Social connections:    Talks on phone: Not on file    Gets together: Not on file    Attends religious service: Not on file    Active member of club or organization: Not on file    Attends meetings of clubs or organizations: Not on file    Relationship status: Not on file  . Intimate partner violence:    Fear of current or ex partner: Not on file    Emotionally abused: Not on file    Physically abused: Not on file    Forced sexual activity: Not on file  Other Topics Concern  . Not on file  Social History Narrative   Married. Lives with husband.    Entered 2015--Daughter and Granddaughter live with them now.    3 children. 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      Walks with husband every night for 30 minutes.( This was in 2014)   In 2015--pt says they walk sometimes.    She is active with granddaughter even on days they do not walk.    Family History  Problem Relation Age of Onset  . Heart disease Father   . Diabetes Father   . Stroke Brother 50       stroke  . Colon cancer Neg Hx     Physical Exam: Blood pressure 100/60, pulse 88, temperature 97.9 F (36.6 C), temperature source Oral, resp. rate 16, height 5\' 1"  (1.549 m), weight 79.4 kg, SpO2 97 %., Body mass index is 33.07 kg/m. General: WNWD WF. Appears in no acute distress. Head: Normocephalic,  atraumatic, eyes without discharge, sclera non-icteric, nares are without discharge. Bilateral auditory canals clear, TM's are without perforation, pearly grey and translucent with reflective cone of light bilaterally. Oral cavity moist, posterior pharynx without exudate, erythema. She wears bilateral hearing aids.  Neck: Supple. No thyromegaly. No lymphadenopathy. No carotid bruits. Lungs: Clear bilaterally to auscultation without wheezes, rales, or rhonchi. Breathing is unlabored. Heart: RRR with S1 S2. No murmurs, rubs, or gallops. Breast Exam: Inspection is normal.  Breasts are symmetrical.  There is no skin changes.  Patient is normal with no masses.  No nipple discharge. Abdomen: Soft, non-tender, non-distended with normoactive bowel sounds. No hepatomegaly. No  rebound/guarding. No obvious abdominal masses. Pelvic Exam: External genitalia normal.  Vaginal mucosa normal.  Cervix normal.  Bimanual exam is normal with no cervical motion tenderness.  No adnexal mass. Musculoskeletal:  Strength and tone normal for age. Tinel's is negative. Phalen's is negative. Extremities/Skin: Warm and dry. No edema. No rashes or suspicious lesions. Neuro: Alert and oriented X 3. Moves all extremities spontaneously. Gait is normal. CNII-XII grossly in tact. Psych:  Responds to questions appropriately with a normal affect.      Assessment/Plan:  62 y.o. y/o female here for CPE  1. Visit for preventive health examination  A.Screening labs:  - CBC with Differential/Platelet - COMPLETE METABOLIC PANEL WITH GFR - Lipid panel - TSH  B. Pap Smear:  Pap smear with cytology and HPV co-testing 04/05/15----Negative Cytology./ Negative HPV Last Pap smear above.  It has been 3 years so will repeat Pap smear now. PAP, Thin Prep w/HPV rflx HPV Type 16/18   C.  Screening mammogram:  Last mammogram 05/08/2017.  Negative.  Can wait until closer to age 62 to start doing bone density scans.  D. colorectal  cancer screening: She had a colonoscopy March 2016. She states that it did show 2 polyps. Today I did review report in epic.   Colonoscopy was performed 08/22/2014.  Dr. Lucio Edward.  Revealed 2 polyps.  States " await pathology report results.  Repeat colonoscopy in 5 years if polyp adenomatous.  Otherwise 10 years. Pt does not know when she is supposed to follow-up and I have not seen pathology report. However, based on that colonoscopy report --- next colonoscopy would be due either 08/22/2019 or 08/21/2024. Therefore will wait until closer to 2021 to determine time for follow-up.   E. Immunizations:  Influenza vaccine--- She is agreeable to get today. Tetanus vaccine was given here 03/25/2012 as Tdap.--UpToDate Shingrix--------- She is to check with her insurance regarding coverage/cost and if agreeable to proceed will get at pharmacy. Pneumonia Vaccines  at age 71.   2. Essential hypertension Blood pressure is well controlled.  It is actually on the low side but she is having no lightheadedness or other adverse effects.  Therefore will continue current medication.  Check lab to monitor. - COMPLETE METABOLIC PANEL WITH GFR  3. Paresthesia of finger She is having numbness sensation on her right thumb, index finger, middle finger. I thought it may be carpal tunnel but her Phalen's and Tinel's tests are negative. I will obtain labs.  If these are normal then will proceed with nerve conduction study. - COMPLETE METABOLIC PANEL WITH GFR - Hemoglobin A1c - Vitamin B12 - Nerve conduction test; Future       Signed, [address] [place], [place], [facility] 03/12/2018 3:39 PM

## 2018-03-13 LAB — LIPID PANEL
CHOLESTEROL: 156 mg/dL (ref <200)
HDL: 63 mg/dL (ref >50)
LDL CHOLESTEROL (CALC): 79 mg/dL
Non-HDL Cholesterol (Calc): 93 mg/dL (calc) (ref <130)
Total CHOL/HDL Ratio: 2.5 (calc) (ref <5.0)
Triglycerides: 60 mg/dL (ref <150)

## 2018-03-13 LAB — COMPLETE METABOLIC PANEL WITH GFR
AG RATIO: 1.9 (calc) (ref 1.0–2.5)
ALBUMIN MSPROF: 4.4 g/dL (ref 3.6–5.1)
ALKALINE PHOSPHATASE (APISO): 72 U/L (ref 33–130)
ALT: 10 U/L (ref 6–29)
AST: 14 U/L (ref 10–35)
BUN: 20 mg/dL (ref 7–25)
CO2: 28 mmol/L (ref 20–32)
CREATININE: 0.82 mg/dL (ref 0.50–0.99)
Calcium: 9.3 mg/dL (ref 8.6–10.4)
Chloride: 104 mmol/L (ref 98–110)
GFR, Est African American: 90 mL/min/{1.73_m2} (ref >=60)
GFR, Est Non African American: 77 mL/min/{1.73_m2} (ref >=60)
Globulin: 2.3 g/dL (calc) (ref 1.9–3.7)
Glucose, Bld: 107 mg/dL — ABNORMAL HIGH (ref 65–99)
POTASSIUM: 4.1 mmol/L (ref 3.5–5.3)
Sodium: 140 mmol/L (ref 135–146)
Total Bilirubin: 0.8 mg/dL (ref 0.2–1.2)
Total Protein: 6.7 g/dL (ref 6.1–8.1)

## 2018-03-13 LAB — CBC WITH DIFFERENTIAL/PLATELET
BASOS PCT: 0.5 %
Basophils Absolute: 39 cells/uL (ref 0–200)
EOS ABS: 164 {cells}/uL (ref 15–500)
Eosinophils Relative: 2.1 %
HCT: 39.6 % (ref 35.0–45.0)
Hemoglobin: 13.4 g/dL (ref 11.7–15.5)
Lymphs Abs: 1412 cells/uL (ref 850–3900)
MCH: 30.7 pg (ref 27.0–33.0)
MCHC: 33.8 g/dL (ref 32.0–36.0)
MCV: 90.6 fL (ref 80.0–100.0)
MPV: 10.7 fL (ref 7.5–12.5)
Monocytes Relative: 8 %
NEUTROS PCT: 71.3 %
Neutro Abs: 5561 cells/uL (ref 1500–7800)
PLATELETS: 183 10*3/uL (ref 140–400)
RBC: 4.37 10*6/uL (ref 3.80–5.10)
RDW: 12.6 % (ref 11.0–15.0)
TOTAL LYMPHOCYTE: 18.1 %
WBC: 7.8 10*3/uL (ref 3.8–10.8)
WBCMIX: 624 {cells}/uL (ref 200–950)

## 2018-03-13 LAB — VITAMIN B12: Vitamin B-12: 364 pg/mL (ref 200–1100)

## 2018-03-13 LAB — HEMOGLOBIN A1C
HEMOGLOBIN A1C: 6.1 %{Hb} — AB (ref <5.7)
Mean Plasma Glucose: 128 (calc)
eAG (mmol/L): 7.1 (calc)

## 2018-03-13 LAB — TSH: TSH: 0.93 mIU/L (ref 0.40–4.50)

## 2018-03-16 LAB — PAP, TP IMAGING W/ HPV RNA, RFLX HPV TYPE 16,18/45: HPV DNA High Risk: NOT DETECTED

## 2018-03-20 ENCOUNTER — Other Ambulatory Visit: Payer: Self-pay | Admitting: Physician Assistant

## 2018-03-20 DIAGNOSIS — R202 Paresthesia of skin: Secondary | ICD-10-CM

## 2018-03-20 NOTE — Progress Notes (Signed)
Per [facility] they like to receive Nerve Conduction studies via referrals workqueue. Order for referral placed

## 2018-04-22 ENCOUNTER — Encounter: Payer: Self-pay | Admitting: Neurology

## 2018-04-22 ENCOUNTER — Ambulatory Visit (INDEPENDENT_AMBULATORY_CARE_PROVIDER_SITE_OTHER): Payer: BC Managed Care – PPO | Admitting: Neurology

## 2018-04-22 ENCOUNTER — Ambulatory Visit: Payer: BC Managed Care – PPO | Admitting: Neurology

## 2018-04-22 DIAGNOSIS — G5601 Carpal tunnel syndrome, right upper limb: Secondary | ICD-10-CM

## 2018-04-22 HISTORY — DX: Carpal tunnel syndrome, right upper limb: G56.01

## 2018-04-22 NOTE — Progress Notes (Signed)
[facility]    Nerve / Sites Muscle Latency Ref. Amplitude Ref. Rel Amp Segments Distance Velocity Ref. Area    ms ms mV mV %  cm m/s m/s mVms  R Median - APB     Wrist APB 8.6 ?4.4 1.6 ?4.0 100 Wrist - APB 7   6.0     Upper arm APB 12.7  1.1  65.9 Upper arm - Wrist 20 49 ?49 5.0  L Median - APB     Wrist APB 3.9 ?4.4 6.0 ?4.0 100 Wrist - APB 7   22.4     Upper arm APB 7.7  5.9  99.2 Upper arm - Wrist 20 53 ?49 21.4  R Ulnar - ADM     Wrist ADM 3.2 ?3.3 10.2 ?6.0 100 Wrist - ADM 7   37.4     B.Elbow ADM 6.1  9.5  92.9 B.Elbow - Wrist 15 51 ?49 37.1     A.Elbow ADM 7.8  9.3  98.6 A.Elbow - B.Elbow 10 60 ?49 38.2         A.Elbow - Wrist               SNC    Nerve / Sites Rec. Site Peak Lat Ref.  Amp Ref. Segments Distance    ms ms V V  cm  R Median - Orthodromic (Dig II, Mid palm)     Dig II Wrist NR ?3.4 NR ?10 Dig II - Wrist 13  L Median - Orthodromic (Dig II, Mid palm)     Dig II Wrist 3.4 ?3.4 13 ?10 Dig II - Wrist 13  R Ulnar - Orthodromic, (Dig V, Mid palm)     Dig V Wrist 3.1 ?3.1 5 ?5 Dig V - Wrist 69           F  Wave    Nerve F Lat Ref.   ms ms  R Ulnar - ADM 26.0 ?32.0       EMG full

## 2018-04-22 NOTE — Progress Notes (Signed)
Please refer to EMG and nerve conduction procedure note.  

## 2018-04-22 NOTE — Procedures (Signed)
     HISTORY:  Faith Montgomery is a 62 year old patient with a history of right hand numbness and some discomfort over the last 2 months.  The patient denies any neck pain or pain down the right arm.  She denies any symptoms of the left arm.  NERVE CONDUCTION STUDIES:  Nerve conduction studies were performed on both upper extremities.  The distal motor latencies for the median nerves were prolonged on the right, normal on the left, with a low motor amplitude on the right, normal on the left.  The nerve conduction velocities for these nerves were normal bilaterally.  The right ulnar nerve reveals a normal distal motor latency and motor amplitude with normal nerve conduction velocities.  The sensory latencies for the median nerves were unobtainable on the right, and normal on the left.  The right ulnar sensory latency is normal.  The F-wave latency for the right ulnar nerve was normal.  EMG STUDIES:  EMG study was performed on the right upper extremity:  The first dorsal interosseous muscle reveals 2 to 4 K units with full recruitment. No fibrillations or positive waves were noted. The abductor pollicis brevis muscle reveals 2 to 4 K units with full recruitment. No fibrillations or positive waves were noted. The extensor indicis proprius muscle reveals 1 to 3 K units with full recruitment. No fibrillations or positive waves were noted. The pronator teres muscle reveals 2 to 3 K units with full recruitment. No fibrillations or positive waves were noted. The biceps muscle reveals 1 to 2 K units with full recruitment. No fibrillations or positive waves were noted. The triceps muscle reveals 2 to 4 K units with full recruitment. No fibrillations or positive waves were noted. The anterior deltoid muscle reveals 2 to 3 K units with full recruitment. No fibrillations or positive waves were noted. The cervical paraspinal muscles were tested at 2 levels. No abnormalities of insertional activity were seen  at either level tested. There was poor relaxation.   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  EMG evaluation of the right upper extremity shows no evidence of an overlying cervical radiculopathy.  Jill Alexanders MD 04/22/2018 4:16 PM  [facility] [address] [place], [place] [address]  Phone [phone] Fax [phone]

## 2018-04-23 NOTE — Progress Notes (Signed)
Appears to have carpal tunnel syndrome in the right arm.  Not sure who ordered but I would recommend hand surgery consult if bothersome to her.

## 2018-04-25 NOTE — Progress Notes (Signed)
Call placed to patient. LMTRC.  

## 2018-04-28 ENCOUNTER — Other Ambulatory Visit: Payer: Self-pay | Admitting: Family Medicine

## 2018-04-28 ENCOUNTER — Telehealth: Payer: Self-pay | Admitting: Family Medicine

## 2018-04-28 DIAGNOSIS — G5601 Carpal tunnel syndrome, right upper limb: Secondary | ICD-10-CM

## 2018-04-28 DIAGNOSIS — R202 Paresthesia of skin: Secondary | ICD-10-CM

## 2018-04-28 NOTE — Telephone Encounter (Signed)
Opened in error

## 2018-04-28 NOTE — Progress Notes (Signed)
Spoke with patient and informed her of results to Nerve conduction study. She stated that the carpal tunnel to right arm is bothersome and she would like to be referred. Nerve Conduction study was ordered by Olean Ree in October. Ordrs placed to hand surgery.

## 2018-05-15 ENCOUNTER — Other Ambulatory Visit: Payer: Self-pay | Admitting: Orthopedic Surgery

## 2018-06-19 ENCOUNTER — Other Ambulatory Visit (HOSPITAL_COMMUNITY): Payer: Self-pay | Admitting: Family Medicine

## 2018-06-19 DIAGNOSIS — Z1231 Encounter for screening mammogram for malignant neoplasm of breast: Secondary | ICD-10-CM

## 2018-06-25 ENCOUNTER — Encounter (HOSPITAL_BASED_OUTPATIENT_CLINIC_OR_DEPARTMENT_OTHER): Payer: Self-pay | Admitting: *Deleted

## 2018-06-25 ENCOUNTER — Other Ambulatory Visit: Payer: Self-pay

## 2018-06-26 ENCOUNTER — Ambulatory Visit (HOSPITAL_COMMUNITY)
Admission: RE | Admit: 2018-06-26 | Discharge: 2018-06-26 | Disposition: A | Discharge status: Home / Self Care | Payer: BC Managed Care – PPO | Source: Ambulatory Visit | Attending: Family Medicine | Admitting: Family Medicine

## 2018-06-26 ENCOUNTER — Encounter (HOSPITAL_COMMUNITY): Payer: Self-pay

## 2018-06-26 DIAGNOSIS — Z1231 Encounter for screening mammogram for malignant neoplasm of breast: Secondary | ICD-10-CM | POA: Insufficient documentation

## 2018-06-29 ENCOUNTER — Encounter (HOSPITAL_BASED_OUTPATIENT_CLINIC_OR_DEPARTMENT_OTHER)
Admission: RE | Admit: 2018-06-29 | Discharge: 2018-06-29 | Disposition: A | Discharge status: Home / Self Care | Payer: BC Managed Care – PPO | Source: Ambulatory Visit | Attending: Orthopedic Surgery | Admitting: Orthopedic Surgery

## 2018-06-29 DIAGNOSIS — Z01810 Encounter for preprocedural cardiovascular examination: Secondary | ICD-10-CM | POA: Insufficient documentation

## 2018-07-09 ENCOUNTER — Encounter (HOSPITAL_BASED_OUTPATIENT_CLINIC_OR_DEPARTMENT_OTHER)
Admission: RE | Disposition: A | Discharge status: Home / Self Care | Payer: Self-pay | Source: Home / Self Care | Attending: Orthopedic Surgery

## 2018-07-09 ENCOUNTER — Ambulatory Visit (HOSPITAL_BASED_OUTPATIENT_CLINIC_OR_DEPARTMENT_OTHER)
Admission: RE | Admit: 2018-07-09 | Discharge: 2018-07-09 | Disposition: A | Discharge status: Home / Self Care | Payer: BC Managed Care – PPO | Attending: Orthopedic Surgery | Admitting: Orthopedic Surgery

## 2018-07-09 ENCOUNTER — Ambulatory Visit (HOSPITAL_BASED_OUTPATIENT_CLINIC_OR_DEPARTMENT_OTHER): Payer: BC Managed Care – PPO | Admitting: Anesthesiology

## 2018-07-09 ENCOUNTER — Encounter (HOSPITAL_BASED_OUTPATIENT_CLINIC_OR_DEPARTMENT_OTHER): Payer: Self-pay | Admitting: Anesthesiology

## 2018-07-09 ENCOUNTER — Other Ambulatory Visit: Payer: Self-pay

## 2018-07-09 DIAGNOSIS — I10 Essential (primary) hypertension: Secondary | ICD-10-CM | POA: Insufficient documentation

## 2018-07-09 DIAGNOSIS — G5601 Carpal tunnel syndrome, right upper limb: Secondary | ICD-10-CM | POA: Insufficient documentation

## 2018-07-09 DIAGNOSIS — Z888 Allergy status to other drugs, medicaments and biological substances status: Secondary | ICD-10-CM | POA: Insufficient documentation

## 2018-07-09 DIAGNOSIS — Z79899 Other long term (current) drug therapy: Secondary | ICD-10-CM | POA: Diagnosis not present

## 2018-07-09 HISTORY — PX: CARPAL TUNNEL RELEASE: SHX101

## 2018-07-09 SURGERY — CARPAL TUNNEL RELEASE
Anesthesia: Regional | Site: Wrist | Laterality: Right

## 2018-07-09 MED ORDER — DEXAMETHASONE SODIUM PHOSPHATE 10 MG/ML IJ SOLN
INTRAMUSCULAR | Status: AC
  Filled 2018-07-09: qty 1

## 2018-07-09 MED ORDER — FENTANYL CITRATE (PF) 100 MCG/2ML IJ SOLN: 25.0000 ug | INTRAMUSCULAR | Status: DC | PRN

## 2018-07-09 MED ORDER — LACTATED RINGERS IV SOLN
INTRAVENOUS | Status: DC
  Administered 2018-07-09: 11:00:00 via INTRAVENOUS

## 2018-07-09 MED ORDER — FENTANYL CITRATE (PF) 100 MCG/2ML IJ SOLN
INTRAMUSCULAR | Status: AC
  Filled 2018-07-09: qty 2

## 2018-07-09 MED ORDER — SCOPOLAMINE 1 MG/3DAYS TD PT72: 1.0000 | MEDICATED_PATCH | Freq: Once | TRANSDERMAL | Status: DC | PRN

## 2018-07-09 MED ORDER — ONDANSETRON HCL 4 MG/2ML IJ SOLN
INTRAMUSCULAR | Status: DC | PRN
  Administered 2018-07-09: 4 mg via INTRAVENOUS

## 2018-07-09 MED ORDER — KETOROLAC TROMETHAMINE 30 MG/ML IJ SOLN
INTRAMUSCULAR | Status: DC | PRN
  Administered 2018-07-09: 30 mg via INTRAVENOUS

## 2018-07-09 MED ORDER — LIDOCAINE 2% (20 MG/ML) 5 ML SYRINGE
INTRAMUSCULAR | Status: AC
  Filled 2018-07-09: qty 5

## 2018-07-09 MED ORDER — MIDAZOLAM HCL 2 MG/2ML IJ SOLN
1.0000 mg | INTRAMUSCULAR | Status: DC | PRN
  Administered 2018-07-09: 2 mg via INTRAVENOUS

## 2018-07-09 MED ORDER — KETOROLAC TROMETHAMINE 30 MG/ML IJ SOLN
INTRAMUSCULAR | Status: AC
  Filled 2018-07-09: qty 1

## 2018-07-09 MED ORDER — FENTANYL CITRATE (PF) 100 MCG/2ML IJ SOLN
50.0000 ug | INTRAMUSCULAR | Status: DC | PRN
  Administered 2018-07-09: 50 ug via INTRAVENOUS

## 2018-07-09 MED ORDER — OXYCODONE HCL 5 MG/5ML PO SOLN: 5.0000 mg | Freq: Once | ORAL | Status: DC | PRN

## 2018-07-09 MED ORDER — MIDAZOLAM HCL 2 MG/2ML IJ SOLN
INTRAMUSCULAR | Status: AC
  Filled 2018-07-09: qty 2

## 2018-07-09 MED ORDER — HYDROCODONE-ACETAMINOPHEN 5-325 MG PO TABS: ORAL_TABLET | ORAL | 0 refills | Status: DC

## 2018-07-09 MED ORDER — DEXAMETHASONE SODIUM PHOSPHATE 10 MG/ML IJ SOLN
INTRAMUSCULAR | Status: DC | PRN
  Administered 2018-07-09: 5 mg via INTRAVENOUS

## 2018-07-09 MED ORDER — ONDANSETRON HCL 4 MG/2ML IJ SOLN: 4.0000 mg | Freq: Once | INTRAMUSCULAR | Status: DC | PRN

## 2018-07-09 MED ORDER — CHLORHEXIDINE GLUCONATE 4 % EX LIQD: 60.0000 mL | Freq: Once | CUTANEOUS | Status: DC

## 2018-07-09 MED ORDER — CEFAZOLIN SODIUM-DEXTROSE 2-4 GM/100ML-% IV SOLN
INTRAVENOUS | Status: AC
  Filled 2018-07-09: qty 100

## 2018-07-09 MED ORDER — CEFAZOLIN SODIUM-DEXTROSE 2-4 GM/100ML-% IV SOLN
2.0000 g | INTRAVENOUS | Status: AC
  Administered 2018-07-09: 2 g via INTRAVENOUS

## 2018-07-09 MED ORDER — ACETAMINOPHEN 160 MG/5ML PO SOLN: 325.0000 mg | ORAL | Status: DC | PRN

## 2018-07-09 MED ORDER — ACETAMINOPHEN 325 MG PO TABS: 325.0000 mg | ORAL_TABLET | ORAL | Status: DC | PRN

## 2018-07-09 MED ORDER — BUPIVACAINE HCL (PF) 0.25 % IJ SOLN
INTRAMUSCULAR | Status: DC | PRN
  Administered 2018-07-09: 7 mL

## 2018-07-09 MED ORDER — OXYCODONE HCL 5 MG PO TABS: 5.0000 mg | ORAL_TABLET | Freq: Once | ORAL | Status: DC | PRN

## 2018-07-09 MED ORDER — ONDANSETRON HCL 4 MG/2ML IJ SOLN
INTRAMUSCULAR | Status: AC
  Filled 2018-07-09: qty 2

## 2018-07-09 MED ORDER — MEPERIDINE HCL 25 MG/ML IJ SOLN: 6.2500 mg | INTRAMUSCULAR | Status: DC | PRN

## 2018-07-09 MED ORDER — LIDOCAINE HCL (PF) 0.5 % IJ SOLN
INTRAMUSCULAR | Status: DC | PRN
  Administered 2018-07-09: 35 mL via INTRAVENOUS

## 2018-07-09 SURGICAL SUPPLY — 39 items
BANDAGE ACE 3X5.8 VEL STRL LF (GAUZE/BANDAGES/DRESSINGS) ×3 IMPLANT
BLADE SURG 15 STRL LF DISP TIS (BLADE) ×2 IMPLANT
BLADE SURG 15 STRL SS (BLADE) ×4
BNDG ESMARK 4X9 LF (GAUZE/BANDAGES/DRESSINGS) ×3 IMPLANT
BNDG GAUZE ELAST 4 BULKY (GAUZE/BANDAGES/DRESSINGS) ×3 IMPLANT
CHLORAPREP W/TINT 26ML (MISCELLANEOUS) ×3 IMPLANT
CORD BIPOLAR FORCEPS 12FT (ELECTRODE) ×3 IMPLANT
COVER BACK TABLE 60X90IN (DRAPES) ×3 IMPLANT
COVER MAYO STAND STRL (DRAPES) ×3 IMPLANT
COVER WAND RF STERILE (DRAPES) IMPLANT
CUFF TOURNIQUET SINGLE 18IN (TOURNIQUET CUFF) ×3 IMPLANT
DRAPE EXTREMITY T 121X128X90 (DISPOSABLE) ×3 IMPLANT
DRAPE SURG 17X23 STRL (DRAPES) ×3 IMPLANT
DRSG PAD ABDOMINAL 8X10 ST (GAUZE/BANDAGES/DRESSINGS) ×3 IMPLANT
GAUZE SPONGE 4X4 12PLY STRL (GAUZE/BANDAGES/DRESSINGS) ×3 IMPLANT
GAUZE XEROFORM 1X8 LF (GAUZE/BANDAGES/DRESSINGS) ×3 IMPLANT
GLOVE BIO SURGEON STRL SZ7.5 (GLOVE) ×3 IMPLANT
GLOVE BIOGEL PI IND STRL 7.0 (GLOVE) ×2 IMPLANT
GLOVE BIOGEL PI IND STRL 7.5 (GLOVE) ×1 IMPLANT
GLOVE BIOGEL PI IND STRL 8 (GLOVE) ×1 IMPLANT
GLOVE BIOGEL PI INDICATOR 7.0 (GLOVE) ×4
GLOVE BIOGEL PI INDICATOR 7.5 (GLOVE) ×2
GLOVE BIOGEL PI INDICATOR 8 (GLOVE) ×2
GLOVE ECLIPSE 6.5 STRL STRAW (GLOVE) ×3 IMPLANT
GLOVE SURG SS PI 7.0 STRL IVOR (GLOVE) ×3 IMPLANT
GOWN STRL REUS W/ TWL LRG LVL3 (GOWN DISPOSABLE) ×1 IMPLANT
GOWN STRL REUS W/TWL LRG LVL3 (GOWN DISPOSABLE) ×2
GOWN STRL REUS W/TWL XL LVL3 (GOWN DISPOSABLE) ×3 IMPLANT
NEEDLE HYPO 25X1 1.5 SAFETY (NEEDLE) ×3 IMPLANT
NS IRRIG 1000ML POUR BTL (IV SOLUTION) ×3 IMPLANT
PACK BASIN DAY SURGERY FS (CUSTOM PROCEDURE TRAY) ×3 IMPLANT
PADDING CAST ABS 4INX4YD NS (CAST SUPPLIES)
PADDING CAST ABS COTTON 4X4 ST (CAST SUPPLIES) IMPLANT
STOCKINETTE 4X48 STRL (DRAPES) ×3 IMPLANT
SUT ETHILON 4 0 PS 2 18 (SUTURE) ×3 IMPLANT
SYR BULB 3OZ (MISCELLANEOUS) ×3 IMPLANT
SYR CONTROL 10ML LL (SYRINGE) ×3 IMPLANT
TOWEL GREEN STERILE FF (TOWEL DISPOSABLE) ×3 IMPLANT
UNDERPAD 30X30 (UNDERPADS AND DIAPERS) IMPLANT

## 2018-07-09 NOTE — Op Note (Signed)
07/09/2018 [facility]                              OPERATIVE REPORT   PREOPERATIVE DIAGNOSIS:  Right carpal tunnel syndrome.  POSTOPERATIVE DIAGNOSIS:  Right carpal tunnel syndrome.  PROCEDURE:  Right carpal tunnel release.  SURGEON:  [name], MD  ASSISTANT:  none.  ANESTHESIA: Bier block with sedation  IV FLUIDS:  Per anesthesia flow sheet.  ESTIMATED BLOOD LOSS:  Minimal.  COMPLICATIONS:  None.  SPECIMENS:  None.  TOURNIQUET TIME:    Total Tourniquet Time Documented: Forearm (Right) - 25 minutes Total: Forearm (Right) - 25 minutes   DISPOSITION:  Stable to PACU.  LOCATION: [facility]  INDICATIONS:  63 yo female with numbness and tingling in right hand.  Positive nerve conduction studies.  She wishes to have a carpal tunnel release for management of her symptoms.  Risks, benefits and alternatives of surgery were discussed including the risk of blood loss; infection; damage to nerves, vessels, tendons, ligaments, bone; failure of surgery; need for additional surgery; complications with wound healing; continued pain; recurrence of carpal tunnel syndrome; and damage to motor branch. She voiced understanding of these risks and elected to proceed.   OPERATIVE COURSE:  After being identified preoperatively by myself, the patient and I agreed upon the procedure and site of procedure.  The surgical site was marked.  The risks, benefits, and alternatives of the surgery were reviewed and she wished to proceed.  Surgical consent had been signed.  She was given IV Ancef as preoperative antibiotic prophylaxis.  She was transferred to the operating room and placed on the operating room table in supine position with the Right upper extremity on an armboard.  Bier block anesthesia was induced by the anesthesiologist.  Right upper extremity was prepped and draped in normal sterile orthopaedic fashion.  A surgical pause was performed between the surgeons,  anesthesia, and operating room staff, and all were in agreement as to the patient, procedure, and site of procedure.  Tourniquet at the proximal aspect of the forearm had been inflated for the Bier block  Incision was made over the transverse carpal ligament and carried into the subcutaneous tissues by spreading technique.  Bipolar electrocautery was used to obtain hemostasis.  The palmar fascia was sharply incised.  The transverse carpal ligament was identified and sharply incised.  It was incised distally first.  Care was taken to ensure complete decompression distally.  It was then incised proximally.  Scissors were used to split the distal aspect of the volar antebrachial fascia.  A finger was placed into the wound to ensure complete decompression, which was the case.  The nerve was examined.  It was flattened and hyperemic. and It was adherent to the radial leaflet.  The motor branch was identified and was intact.  The wound was copiously irrigated with sterile saline.  It was then closed with 4-0 nylon in a horizontal mattress fashion.  It was injected with 0.25% plain Marcaine to aid in postoperative analgesia.  It was dressed with sterile Xeroform, 4x4s, an ABD, and wrapped with Kerlix and an Ace bandage.  Tourniquet was deflated at 25 minutes.  Fingertips were pink with brisk capillary refill after deflation of the tourniquet.  Operative drapes were broken down.  The patient was awoken from anesthesia safely.  She was transferred back to stretcher and taken to the PACU in stable condition.  I  will see her back in the office in 1 week for postoperative followup.  I will give her a prescription for Norco 5/325 1-2 tabs PO q6 hours prn pain, dispense # 20.    [name], MD Electronically signed, 07/09/18

## 2018-07-09 NOTE — Anesthesia Procedure Notes (Signed)
Procedure Name: MAC Performed by: Terrance Mass, CRNA Pre-anesthesia Checklist: Patient identified, Emergency Drugs available, Suction available, Patient being monitored and Timeout performed Patient Re-evaluated:Patient Re-evaluated prior to induction Oxygen Delivery Method: Simple face mask

## 2018-07-09 NOTE — Transfer of Care (Signed)
Immediate Anesthesia Transfer of Care Note  Patient: Faith Montgomery  Procedure(s) Performed: RIGHT CARPAL TUNNEL RELEASE (Right Wrist)  Patient Location: PACU  Anesthesia Type:MAC and Bier block  Level of Consciousness: awake, alert  and oriented  Airway & Oxygen Therapy: Patient Spontanous Breathing  Post-op Assessment: Report given to RN and Post -op Vital signs reviewed and stable  Post vital signs: Reviewed and stable  Last Vitals:  Vitals Value Taken Time  BP    Temp    Pulse    Resp    SpO2      Last Pain:  Vitals:   07/09/18 1059  TempSrc: Oral  PainSc: 0-No pain         Complications: No apparent anesthesia complications

## 2018-07-09 NOTE — Anesthesia Postprocedure Evaluation (Signed)
Anesthesia Post Note  Patient: Faith Montgomery  Procedure(s) Performed: RIGHT CARPAL TUNNEL RELEASE (Right Wrist)     Patient location during evaluation: PACU Anesthesia Type: Bier Block Level of consciousness: awake and alert Pain management: pain level controlled Vital Signs Assessment: post-procedure vital signs reviewed and stable Respiratory status: spontaneous breathing, nonlabored ventilation and respiratory function stable Cardiovascular status: stable and blood pressure returned to baseline Postop Assessment: no apparent nausea or vomiting Anesthetic complications: no    Last Vitals:  Vitals:   07/09/18 1300 07/09/18 1321  BP: 136/80 (!) 147/79  Pulse: 67 (!) 58  Resp: 12 14  Temp:  36.6 C  SpO2: 96% 97%    Last Pain:  Vitals:   07/09/18 1321  TempSrc:   PainSc: 0-No pain                 Lynda Rainwater

## 2018-07-09 NOTE — Discharge Instructions (Addendum)

## 2018-07-09 NOTE — H&P (Signed)
  Faith Montgomery is an 63 y.o. female.   Chief Complaint: right carpal tunnel syndrome HPI: 63 yo female with numbness in right hand.  Nocturnal symptoms.  Positive nerve conduction studies.  She wishes to have a carpal tunnel release for management of symptoms.  Allergies:  Allergies  Allergen Reactions  . Benazepril     Cough---09/2013    Past Medical History:  Diagnosis Date  . Hypertension   . Right carpal tunnel syndrome 04/22/2018    Past Surgical History:  Procedure Laterality Date  . HAMMER TOE SURGERY Bilateral   . KNEE ARTHROSCOPY     both knees at different times    Family History: Family History  Problem Relation Age of Onset  . Heart disease Father   . Diabetes Father   . Stroke Brother 50       stroke  . Colon cancer Neg Hx     Social History:   reports that she has never smoked. She has never used smokeless tobacco. She reports that she does not drink alcohol or use drugs.  Medications: No medications prior to admission.    No results found for this or any previous visit (from the past 48 hour(s)).  No results found.   A comprehensive review of systems was negative.  Height 5\' 4"  (1.626 m), weight 79.4 kg.  General appearance: alert, cooperative and appears stated age Head: Normocephalic, without obvious abnormality, atraumatic Neck: supple, symmetrical, trachea midline Cardio: regular rate and rhythm Resp: clear to auscultation bilaterally Extremities: decreased sensation thumb, index, long fingers right hand.  Intact capillary refill all digits.  +epl/fpl/io.  No wounds.  Pulses: 2+ and symmetric Skin: Skin color, texture, turgor normal. No rashes or lesions Neurologic: Grossly normal Incision/Wound: none  Assessment/Plan Right carpal tunnel syndrome.  She wishes to proceed with right carpal tunnel release.  Non operative and operative treatment options have been discussed with the patient and patient wishes to proceed with operative  treatment. Risks, benefits, and alternatives of surgery have been discussed and the patient agrees with the plan of care.   [name] 07/09/2018, 8:34 AM

## 2018-07-09 NOTE — Anesthesia Preprocedure Evaluation (Signed)
Anesthesia Evaluation  Patient identified by MRN, date of birth, ID band Patient awake    Reviewed: Allergy & Precautions, H&P , NPO status , Patient's Chart, lab work & pertinent test results, reviewed documented beta blocker date and time   Airway Mallampati: II  TM Distance: >3 FB Neck ROM: full    Dental no notable dental hx.    Pulmonary neg pulmonary ROS,    Pulmonary exam normal breath sounds clear to auscultation       Cardiovascular Exercise Tolerance: Good hypertension, negative cardio ROS   Rhythm:regular Rate:Normal     Neuro/Psych  Neuromuscular disease negative psych ROS   GI/Hepatic negative GI ROS, Neg liver ROS,   Endo/Other  negative endocrine ROS  Renal/GU negative Renal ROS  negative genitourinary   Musculoskeletal   Abdominal   Peds  Hematology negative hematology ROS (+)   Anesthesia Other Findings   Reproductive/Obstetrics negative OB ROS                             Anesthesia Physical Anesthesia Plan  ASA: II  Anesthesia Plan: Bier Block and Bier Block-LIDOCAINE ONLY   Post-op Pain Management:    Induction:   PONV Risk Score and Plan: 2 and Treatment may vary due to age or medical condition and Ondansetron  Airway Management Planned: Nasal Cannula and Simple Face Mask  Additional Equipment:   Intra-op Plan:   Post-operative Plan:   Informed Consent: I have reviewed the patients History and Physical, chart, labs and discussed the procedure including the risks, benefits and alternatives for the proposed anesthesia with the patient or authorized representative who has indicated his/her understanding and acceptance.     Dental Advisory Given  Plan Discussed with: CRNA, Anesthesiologist and Surgeon  Anesthesia Plan Comments:         Anesthesia Quick Evaluation

## 2018-07-09 NOTE — Anesthesia Procedure Notes (Signed)
Anesthesia Regional Block: Bier block (IV Regional)   Pre-Anesthetic Checklist: ,, timeout performed, Correct Patient, Correct Site, Correct Laterality, Correct Procedure,, site marked, surgical consent,, at surgeon's request  Laterality: Right     Needles:  Injection technique: Single-shot  Needle Type: Other      Needle Gauge: 20     Additional Needles:   Procedures:,,,,, intact distal pulses, Esmarch exsanguination, single tourniquet utilized,  Narrative:   Performed by: Personally       

## 2018-07-10 ENCOUNTER — Encounter (HOSPITAL_BASED_OUTPATIENT_CLINIC_OR_DEPARTMENT_OTHER): Payer: Self-pay | Admitting: Orthopedic Surgery

## 2018-10-13 ENCOUNTER — Other Ambulatory Visit: Payer: Self-pay

## 2018-10-13 ENCOUNTER — Ambulatory Visit (INDEPENDENT_AMBULATORY_CARE_PROVIDER_SITE_OTHER): Payer: BC Managed Care – PPO | Admitting: Family Medicine

## 2018-10-13 ENCOUNTER — Other Ambulatory Visit: Payer: Self-pay | Admitting: *Deleted

## 2018-10-13 ENCOUNTER — Encounter: Payer: Self-pay | Admitting: Family Medicine

## 2018-10-13 VITALS — BP 162/94 | HR 76 | Temp 98.7°F | Resp 14 | Ht 61.0 in | Wt 181.0 lb

## 2018-10-13 DIAGNOSIS — M5432 Sciatica, left side: Secondary | ICD-10-CM | POA: Diagnosis not present

## 2018-10-13 MED ORDER — PREDNISONE 20 MG PO TABS: 60.0000 mg | ORAL_TABLET | Freq: Every day | ORAL | 0 refills | Status: DC

## 2018-10-13 MED ORDER — HYDROCODONE-ACETAMINOPHEN 5-325 MG PO TABS: 1.0000 | ORAL_TABLET | Freq: Four times a day (QID) | ORAL | 0 refills | Status: DC | PRN

## 2018-10-13 NOTE — Progress Notes (Signed)
Subjective:    Patient ID: Faith Montgomery, female    DOB: 1956/02/05, 63 y.o.   MRN: [id number]  HPI  Over the weekend, the patient was doing a lot of yard work.  Saturday, she developed severe pain in her lower back.  The pain radiates from her lower back around the level of L5 into her posterior left hip.  The pain radiates down the posterior aspect of her left hamstring and then wraps around her knee to the anterior portion of her left shin into her left foot towards her big toe.  It follows the path of the sciatic nerve exactly.  The pain is burning and stinging in nature.  It is constant but made worse by bending over or standing.  She denies any weakness in her legs.  She denies any numbness in her crotch.  She denies any fevers or chills.  Strength in her legs is normal.   Past Medical History:  Diagnosis Date  . Hypertension   . Right carpal tunnel syndrome 04/22/2018   Past Surgical History:  Procedure Laterality Date  . CARPAL TUNNEL RELEASE Right 07/09/2018   Procedure: RIGHT CARPAL TUNNEL RELEASE;  Surgeon: [name], MD;  Location: [facility];  Service: Orthopedics;  Laterality: Right;  . HAMMER TOE SURGERY Bilateral   . KNEE ARTHROSCOPY     both knees at different times   Current Outpatient Medications on File Prior to Visit  Medication Sig Dispense Refill  . losartan (COZAAR) 50 MG tablet TAKE 1 TABLET BY MOUTH EVERY DAY 90 tablet 3  . meloxicam (MOBIC) 15 MG tablet Take 15 mg by mouth daily.  1   No current facility-administered medications on file prior to visit.    Allergies  Allergen Reactions  . Benazepril     Cough---09/2013   Social History   Socioeconomic History  . Marital status: Married    Spouse name: Not on file  . Number of children: Not on file  . Years of education: Not on file  . Highest education level: Not on file  Occupational History  . 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 Occupation:  Teacher, English as a foreign language  Social Needs  . Financial resource strain: Not on file  . Food insecurity:    Worry: Not on file    Inability: Not on file  . Transportation needs:    Medical: Not on file    Non-medical: Not on file  Tobacco Use  . Smoking status: Never Smoker  . Smokeless tobacco: Never Used  Substance and Sexual Activity  . Alcohol use: No  . Drug use: No  . Sexual activity: Yes    Birth control/protection: Post-menopausal  Lifestyle  . Physical activity:    Days per week: Not on file    Minutes per session: Not on file  . Stress: Not on file  Relationships  . Social connections:    Talks on phone: Not on file    Gets together: Not on file    Attends religious service: Not on file    Active member of club or organization: Not on file    Attends meetings of clubs or organizations: Not on file    Relationship status: Not on file  . Intimate partner violence:    Fear of current or ex partner: Not on file    Emotionally abused: Not on file    Physically abused: Not on file    Forced sexual activity: Not  on file  Other Topics Concern  . Not on file  Social History Narrative   Married. Lives with husband.    Entered 2015--Daughter and Granddaughter live with them now.    3 children. 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      Walks with husband every night for 30 minutes.( This was in 2014)   In 2015--pt says they walk sometimes.    She is active with granddaughter even on days they do not walk.     Review of Systems  All other systems reviewed and are negative.      Objective:   Physical Exam Vitals signs reviewed.  Constitutional:      Appearance: Normal appearance.  Cardiovascular:     Rate and Rhythm: Normal rate and regular rhythm.     Pulses: Normal pulses.     Heart sounds: Normal heart sounds. No murmur.  Pulmonary:     Effort: Pulmonary  effort is normal.     Breath sounds: Normal breath sounds.  Musculoskeletal:     Lumbar back: She exhibits decreased range of motion, tenderness and pain.  Neurological:     Mental Status: She is alert.           Assessment & Plan:  Left sided sciatica  Begin prednisone 60 mg p.o. daily for 5 days.  Use Norco 5/325 1 p.o. every 6 hours as needed pain.  Reassess in 1 week or sooner if worse.  I explained warning signs of cauda equina syndrome to the patient and when to seek medical attention immediately.

## 2018-10-13 NOTE — Addendum Note (Signed)
Addended by: Sheral Flow on: 10/13/2018 01:36 PM   Modules accepted: Orders

## 2018-10-13 NOTE — Telephone Encounter (Signed)
Fellow MD requesting you to send this in for a patient as the patient has already left the office and the electronic prescribing app is not working correctly with [facility].

## 2018-10-19 ENCOUNTER — Other Ambulatory Visit: Payer: Self-pay | Admitting: Family Medicine

## 2018-10-19 ENCOUNTER — Telehealth: Payer: Self-pay | Admitting: Family Medicine

## 2018-10-19 DIAGNOSIS — M5432 Sciatica, left side: Secondary | ICD-10-CM

## 2018-10-19 MED ORDER — HYDROCODONE-ACETAMINOPHEN 5-325 MG PO TABS: 1.0000 | ORAL_TABLET | Freq: Four times a day (QID) | ORAL | 0 refills | Status: DC | PRN

## 2018-10-19 NOTE — Telephone Encounter (Signed)
I will refill her norco.  And we need to get xray of her l-spine

## 2018-10-19 NOTE — Telephone Encounter (Signed)
Pt states that she is still having pain after finishing her medication. Wants to know if we can call in something else for her

## 2018-10-20 ENCOUNTER — Other Ambulatory Visit: Payer: Self-pay

## 2018-10-20 ENCOUNTER — Ambulatory Visit
Admission: RE | Admit: 2018-10-20 | Discharge: 2018-10-20 | Disposition: A | Discharge status: Home / Self Care | Payer: BC Managed Care – PPO | Source: Ambulatory Visit | Attending: Family Medicine | Admitting: Family Medicine

## 2018-10-20 DIAGNOSIS — M5432 Sciatica, left side: Secondary | ICD-10-CM

## 2018-10-20 NOTE — Telephone Encounter (Signed)
Pt aware and states that she is taking her pain q 6 hours and would like to know if she can take it more then that. Per Dr. Dennard Schaumann ok to take q 4 hours and she will go get xray.

## 2018-10-22 ENCOUNTER — Other Ambulatory Visit: Payer: Self-pay | Admitting: Family Medicine

## 2018-10-22 DIAGNOSIS — M5136 Other intervertebral disc degeneration, lumbar region: Secondary | ICD-10-CM

## 2018-10-22 DIAGNOSIS — M5442 Lumbago with sciatica, left side: Secondary | ICD-10-CM

## 2018-10-22 DIAGNOSIS — M5432 Sciatica, left side: Secondary | ICD-10-CM

## 2018-10-22 MED ORDER — PREDNISONE 20 MG PO TABS: 60.0000 mg | ORAL_TABLET | Freq: Every day | ORAL | 0 refills | Status: DC

## 2018-10-28 ENCOUNTER — Other Ambulatory Visit: Payer: Self-pay | Admitting: Family Medicine

## 2018-10-28 DIAGNOSIS — M5432 Sciatica, left side: Secondary | ICD-10-CM

## 2018-10-28 DIAGNOSIS — M5442 Lumbago with sciatica, left side: Secondary | ICD-10-CM

## 2018-10-28 DIAGNOSIS — M5136 Other intervertebral disc degeneration, lumbar region: Secondary | ICD-10-CM

## 2018-10-29 ENCOUNTER — Other Ambulatory Visit: Payer: Self-pay | Admitting: Family Medicine

## 2018-10-29 DIAGNOSIS — M5136 Other intervertebral disc degeneration, lumbar region: Secondary | ICD-10-CM

## 2018-10-29 DIAGNOSIS — M5432 Sciatica, left side: Secondary | ICD-10-CM

## 2018-10-29 DIAGNOSIS — M5442 Lumbago with sciatica, left side: Secondary | ICD-10-CM

## 2018-11-04 ENCOUNTER — Other Ambulatory Visit: Payer: BC Managed Care – PPO

## 2018-12-14 ENCOUNTER — Telehealth (HOSPITAL_COMMUNITY): Payer: Self-pay | Admitting: Family Medicine

## 2018-12-14 NOTE — Telephone Encounter (Signed)
12/14/18  Left patient a message to reschedule tomorrow's eval appt to [facility] on Wed. At 9:15... move her 9:15 patient to Amy, and also cx the Thursday appt because unsure at this point if Otho Ket will be back.

## 2018-12-15 ENCOUNTER — Encounter (HOSPITAL_COMMUNITY): Payer: Self-pay

## 2018-12-15 ENCOUNTER — Ambulatory Visit (HOSPITAL_COMMUNITY): Payer: BC Managed Care – PPO | Admitting: Physical Therapy

## 2018-12-16 ENCOUNTER — Other Ambulatory Visit: Payer: Self-pay

## 2018-12-16 ENCOUNTER — Ambulatory Visit (HOSPITAL_COMMUNITY): Payer: BC Managed Care – PPO | Attending: Neurosurgery

## 2018-12-16 DIAGNOSIS — M6281 Muscle weakness (generalized): Secondary | ICD-10-CM

## 2018-12-16 DIAGNOSIS — M5442 Lumbago with sciatica, left side: Secondary | ICD-10-CM

## 2018-12-16 DIAGNOSIS — R29898 Other symptoms and signs involving the musculoskeletal system: Secondary | ICD-10-CM | POA: Diagnosis present

## 2018-12-16 NOTE — Therapy (Signed)
[facility] [address] [place], [place], [address] Phone: [phone]   Fax:  [phone]  Physical Therapy Evaluation  Patient Details  Name: Faith Montgomery MRN: [id number] Date of Birth: 09-08-1955 Referring Provider (PT): Ashok Pall, MD   Encounter Date: 12/16/2018  PT End of Session - 12/16/18 0953    Visit Number  1    Number of Visits  2    Date for PT Re-Evaluation  01/13/19    Authorization Type  [place] Time Period  12/16/18 to 01/13/19    PT Start Time  0915    PT Stop Time  0948    PT Time Calculation (min)  33 min    Activity Tolerance  Patient tolerated treatment well    Behavior During Therapy  [facility] for tasks assessed/performed       Past Medical History:  Diagnosis Date  . Hypertension   . Right carpal tunnel syndrome 04/22/2018    Past Surgical History:  Procedure Laterality Date  . CARPAL TUNNEL RELEASE Right 07/09/2018   Procedure: RIGHT CARPAL TUNNEL RELEASE;  Surgeon: [name], MD;  Location: [facility];  Service: Orthopedics;  Laterality: Right;  . HAMMER TOE SURGERY Bilateral   . KNEE ARTHROSCOPY     both knees at different times    There were no vitals filed for this visit.   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 Pt reports she has been to multiple doctors and recevied x-ray and MRI that revealed she has bulging discs and sciatica pain. Pt reports she wants to learn some exercises to help her in case the pain returns. Pt reports she rook 2 rounds of prednisone, then another doctor prescribed her muscle relaxors, but she only took a few and she hasn't experienced pain in about a month. Pt reports "I feel like I am back to normal and I'm doing what I did before". Pt reports no difficulty with any tasks nor onset of pain with any tasks. Pt  reports R>L knee pain due to OA, but no falls or limitations from it.    How long can you sit comfortably?  no issues    How long can you stand comfortably?  no issues    How long can you walk comfortably?  no issues    Diagnostic tests  x-ray, MRI per pt report    Patient Stated Goals  get some exericses in case it starts hurting again    Currently in Pain?  No/denies         [facility] PT Assessment - 12/16/18 0001      Assessment   Medical Diagnosis  Lumbar OA with radiculopathy    Referring Provider (PT)  Ashok Pall, MD    Next MD Visit  none    Prior Therapy  not for prior condition      Precautions   Precautions  None      Restrictions   Weight Bearing Restrictions  No      Balance Screen   Has the patient fallen in the past 6 months  No    Has the patient had a decrease in activity level because of a fear of falling?   No    Is the patient reluctant to leave their home because of a fear of falling?  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, Programmer, multimedia garden      Observation/Other Assessments   Focus on Therapeutic Outcomes (FOTO)   n/a (4 week HEP POC)      Sensation   Light Touch  Appears Intact      Functional Tests   Functional tests  Sit to Stand      Sit to Stand   Comments  30 sec STS: 12 reps      ROM / Strength   AROM / PROM / Strength  AROM;Strength      AROM   AROM Assessment Site  Lumbar    Lumbar Flexion  WNL    Lumbar Extension  WNL    Lumbar - Right Side Bend  16.5" from floor    Lumbar - Left Side Bend  18 from floor    Lumbar - Right Rotation  WNL    Lumbar - Left Rotation  WNL      Strength   Strength Assessment Site  Hip;Knee;Ankle    Right Hip Flexion  4/5    Right Hip Extension  3+/5    Right Hip ABduction  4+/5    Left Hip Flexion  4/5    Left Hip Extension  3+/5    Left Hip ABduction  4+/5     Right Knee Flexion  4/5    Right Knee Extension  5/5    Left Knee Flexion  4/5    Left Knee Extension  5/5    Right Ankle Dorsiflexion  5/5    Left Ankle Dorsiflexion  5/5      Palpation   Palpation comment  Pt denies TTP throughout lumbar paraspinals      Special Tests    Special Tests  Lumbar    Lumbar Tests  Slump Test      Slump test   Findings  Negative    Comment  bil      Balance   Balance Assessed  Yes      Static Standing Balance   Static Standing - Balance Support  No upper extremity supported    Static Standing Balance -  Activities   Single Leg Stance - Right Leg;Single Leg Stance - Left Leg    Static Standing - Comment/# of Minutes  R: 2.5 sec or <, L: 3.5 sec or <         Objective measurements completed on examination: See above findings.      PT Education - 12/16/18 0953    Education Details  Assessment findings, 4 week HEP POC, exercise technique    Person(s) Educated  Patient    Methods  Explanation;Demonstration;Handout    Comprehension  Verbalized understanding;Returned demonstration       PT Short Term Goals - 12/16/18 0958      PT SHORT TERM GOAL #1   Title  Pt will be independnet with HEP to maximize gait, improve strength, and reduce risk for falls.    Time  1    Period  Days    Status  Achieved    Target Date  12/16/18         Plan - 12/16/18 0954    Clinical Impression Statement  Pt is a pleasant 63YO female who presents to therapy with back pain flare up in April  2020 with sciatica pain on LLE. Pt reports MRI states she has bulging discs and the doctor told her it is common. Pt currently reports complete resolution of issues, able to perform all tasks without difficulty at this time, ambulates without difficulty or instability, and no other complaints. Pt demonstrates deficits in standing balance AEB SLS test and decreased BLE strength per MMT. Pt completes 12 reps for 30 sec chair rise test without UE use and no instability noted.  PT educated pt on HEP, assigning her handout with illustration and written instruction to perform exercises at home to maintain current pain-free status and to perform if flare up returns per pt request. Pt agrees to 4 week HEP POC and scheduled 1 f/u visit in 4 weeks to re-assess if needed. Pt would benefit from skilled PT to improve BLE strength, improve deficits in balance to reduce risk for falls and improve overall functional mobility to return to PLOF without limitations.    Personal Factors and Comorbidities  Time since onset of injury/illness/exacerbation;Comorbidity 1    Comorbidities  knee OA    Examination-Activity Limitations  Carry;Bend    Examination-Participation Restrictions  Yard Work    Stability/Clinical Decision Making  Stable/Uncomplicated    Clinical Decision Making  Low    Rehab Potential  Good    PT Frequency  Other (comment)   4 week HEP POC, 1 f/u   PT Duration  4 weeks    PT Treatment/Interventions  ADLs/Self Care Home Management;Aquatic Therapy;Biofeedback;Moist Heat;Gait training;Stair training;Functional mobility training;Therapeutic activities;Therapeutic exercise;Balance training;Neuromuscular re-education;Patient/family education;Orthotic Fit/Training;Manual techniques;Passive range of motion;Dry needling;Taping;Joint Manipulations    PT Next Visit Plan  4 week HEP POC, 1 f/u after if needed    PT Home Exercise Plan  See pt instructions for detailed list       Patient will benefit from skilled therapeutic intervention in order to improve the following deficits and impairments:  Abnormal gait, Decreased activity tolerance, Decreased balance, Decreased mobility, Decreased strength, Impaired flexibility, Pain  Visit Diagnosis: 1. Acute bilateral low back pain with left-sided sciatica   2. Other symptoms and signs involving the musculoskeletal system   3. Muscle weakness (generalized)        Problem List Patient Active Problem List   Diagnosis Date Noted  .  Right carpal tunnel syndrome 04/22/2018  . Paresthesia of finger 03/12/2018  . Hypertension      Talbot Grumbling PT, DPT  [facility] [address] [place], [place], [address] Phone: [phone]   Fax:  [phone]  Name: Faith Montgomery MRN: [id number] Date of Birth: 1956-03-30

## 2018-12-16 NOTE — Patient Instructions (Signed)
Exercises Seated Hamstring Stretch - 10 reps - 3 sets - 30 seconds hold - 1x daily - 7x weekly Supine Figure 4 Piriformis Stretch - 10 reps - 3 sets - 1x daily - 7x weekly Standing Hip Abduction - 10 reps - 3 sets - 1x daily - 7x weekly Standing Marching - 10 reps - 3 sets - 1x daily - 7x weekly Standing Hip Extension - 10 reps - 3 sets - 1x daily - 7x weekly Single Leg Stance with Support - 10 reps - 3 sets - 30-60 seconds hold - 1x daily - 7x weekly Sit to Stand - 10 reps - 3 sets - 1x daily - 7x weekly Seated Long Arc Quad - 10 reps - 3 sets - 1x daily - 7x weekly Supine Quad Set - 10 reps - 3 sets - 1x daily - 7x weekly

## 2018-12-17 ENCOUNTER — Ambulatory Visit (HOSPITAL_COMMUNITY): Payer: BC Managed Care – PPO | Admitting: Physical Therapy

## 2018-12-18 ENCOUNTER — Ambulatory Visit (HOSPITAL_COMMUNITY): Payer: BC Managed Care – PPO

## 2018-12-28 ENCOUNTER — Encounter (HOSPITAL_COMMUNITY): Payer: BC Managed Care – PPO | Admitting: Physical Therapy

## 2018-12-31 ENCOUNTER — Encounter (HOSPITAL_COMMUNITY): Payer: BC Managed Care – PPO | Admitting: Physical Therapy

## 2019-01-04 ENCOUNTER — Encounter (HOSPITAL_COMMUNITY): Payer: BC Managed Care – PPO | Admitting: Physical Therapy

## 2019-01-07 ENCOUNTER — Encounter (HOSPITAL_COMMUNITY): Payer: BC Managed Care – PPO | Admitting: Physical Therapy

## 2019-01-11 ENCOUNTER — Encounter (HOSPITAL_COMMUNITY): Payer: Self-pay

## 2019-01-11 ENCOUNTER — Telehealth (HOSPITAL_COMMUNITY): Payer: Self-pay | Admitting: Family Medicine

## 2019-01-11 NOTE — Telephone Encounter (Signed)
01/11/19  pt called to say that she didn't need to keep this appt and to discharge her

## 2019-01-11 NOTE — Therapy (Unsigned)
[facility] [place], [place], [address] Phone: [phone]   Fax:  [phone]  Patient Details  Name: Faith Montgomery MRN: [id number] Date of Birth: 10-Sep-1955 Referring Provider:  No ref. provider found  Encounter Date: 01/11/2019   PHYSICAL THERAPY DISCHARGE SUMMARY  Visits from Start of Care: 1  Current functional level related to goals / functional outcomes: See eval note 12/16/18   Remaining deficits: See eval note 12/16/18   Education / Equipment: See eval note 12/16/18  Plan: Patient agrees to discharge.  Patient goals were not met. Patient is being discharged due to the patient's request.  ?????    Per front office staff Mandy, Pt called to request to be discharged and for appointment this week to be cancelled.  Talbot Grumbling PT, DPT 01/11/19, 9:27 AM [place], [place], [address] Phone: [phone]   Fax:  [phone]

## 2019-01-13 ENCOUNTER — Ambulatory Visit (HOSPITAL_COMMUNITY): Payer: BC Managed Care – PPO

## 2019-03-23 ENCOUNTER — Other Ambulatory Visit: Payer: Self-pay | Admitting: Family Medicine

## 2019-04-18 ENCOUNTER — Other Ambulatory Visit: Payer: Self-pay | Admitting: Family Medicine

## 2019-05-28 ENCOUNTER — Other Ambulatory Visit (HOSPITAL_COMMUNITY): Payer: Self-pay | Admitting: Family Medicine

## 2019-05-28 DIAGNOSIS — Z1231 Encounter for screening mammogram for malignant neoplasm of breast: Secondary | ICD-10-CM

## 2019-05-31 ENCOUNTER — Encounter: Payer: BC Managed Care – PPO | Admitting: Family Medicine

## 2019-06-23 ENCOUNTER — Ambulatory Visit: Payer: BC Managed Care – PPO | Attending: Internal Medicine

## 2019-06-23 ENCOUNTER — Other Ambulatory Visit: Payer: Self-pay

## 2019-06-23 DIAGNOSIS — Z20822 Contact with and (suspected) exposure to covid-19: Secondary | ICD-10-CM

## 2019-06-24 LAB — NOVEL CORONAVIRUS, NAA: SARS-CoV-2, NAA: NOT DETECTED

## 2019-06-30 ENCOUNTER — Ambulatory Visit (HOSPITAL_COMMUNITY)
Admission: RE | Admit: 2019-06-30 | Discharge: 2019-06-30 | Disposition: A | Discharge status: Home / Self Care | Payer: BC Managed Care – PPO | Source: Ambulatory Visit | Attending: Family Medicine | Admitting: Family Medicine

## 2019-06-30 ENCOUNTER — Other Ambulatory Visit: Payer: Self-pay

## 2019-06-30 DIAGNOSIS — Z1231 Encounter for screening mammogram for malignant neoplasm of breast: Secondary | ICD-10-CM | POA: Insufficient documentation

## 2019-07-02 ENCOUNTER — Other Ambulatory Visit (HOSPITAL_COMMUNITY): Payer: Self-pay | Admitting: Family Medicine

## 2019-07-02 DIAGNOSIS — R928 Other abnormal and inconclusive findings on diagnostic imaging of breast: Secondary | ICD-10-CM

## 2019-07-13 ENCOUNTER — Ambulatory Visit (HOSPITAL_COMMUNITY)
Admission: RE | Admit: 2019-07-13 | Discharge: 2019-07-13 | Disposition: A | Discharge status: Home / Self Care | Payer: BC Managed Care – PPO | Source: Ambulatory Visit | Attending: Family Medicine | Admitting: Family Medicine

## 2019-07-13 ENCOUNTER — Ambulatory Visit (HOSPITAL_COMMUNITY): Admission: RE | Admit: 2019-07-13 | Payer: BC Managed Care – PPO | Source: Ambulatory Visit

## 2019-07-13 ENCOUNTER — Other Ambulatory Visit: Payer: Self-pay

## 2019-07-13 DIAGNOSIS — R928 Other abnormal and inconclusive findings on diagnostic imaging of breast: Secondary | ICD-10-CM | POA: Diagnosis present

## 2019-07-17 ENCOUNTER — Other Ambulatory Visit: Payer: Self-pay | Admitting: Family Medicine

## 2019-07-20 ENCOUNTER — Encounter: Payer: Self-pay | Admitting: Family Medicine

## 2019-07-20 ENCOUNTER — Other Ambulatory Visit: Payer: Self-pay

## 2019-07-20 ENCOUNTER — Ambulatory Visit (INDEPENDENT_AMBULATORY_CARE_PROVIDER_SITE_OTHER): Payer: BC Managed Care – PPO | Admitting: Family Medicine

## 2019-07-20 VITALS — BP 160/74 | HR 88 | Temp 96.6°F | Resp 18 | Ht 61.0 in | Wt 193.0 lb

## 2019-07-20 DIAGNOSIS — M5136 Other intervertebral disc degeneration, lumbar region: Secondary | ICD-10-CM | POA: Diagnosis not present

## 2019-07-20 DIAGNOSIS — I10 Essential (primary) hypertension: Secondary | ICD-10-CM | POA: Diagnosis not present

## 2019-07-20 DIAGNOSIS — Z0000 Encounter for general adult medical examination without abnormal findings: Secondary | ICD-10-CM | POA: Diagnosis not present

## 2019-07-20 NOTE — Progress Notes (Signed)
Subjective:    Patient ID: Faith Montgomery, female    DOB: 04-13-1956, 65 y.o.   MRN: [id number]  HPI  Patient is a 64 year old Caucasian female here today for complete physical exam.  Since I last saw the patient, she has had surgery on her right knee.  She is doing better although she still has some trouble going up and down steps.  She also reports some pain and instability in her left knee however she has a follow-up to see her orthopedist in a week and she plans to discuss it with him.  Her blood pressure is elevated today.  She is taking losartan daily.  She is not missing any doses.  She denies any chest pain shortness of breath or dyspnea on exertion.  Her last Pap smear was performed in 2019 and is not due again until 2022.  She has never had a bone density however she denies any family history of osteoporosis and therefore is not due for a bone density test until 65.  She is taking meloxicam every day for joint pain.  I discussed the GI toxicity to taking an NSAID every day and recommended that she give her body breaks where possible. Last colonoscopy 2016- benign polyp Q 10 years. Mammogram 2/21 (UTD)  Immunization History  Administered Date(s) Administered  . Influenza,inj,Quad PF,6+ Mos 03/25/2013, 03/28/2014, 04/05/2015, 04/10/2016, 03/25/2017, 03/12/2018  . Influenza-Unspecified 03/25/2012, 03/11/2019  . Td 03/25/2012  . Tdap 03/25/2012    Past Medical History:  Diagnosis Date  . Hypertension   . Right carpal tunnel syndrome 04/22/2018   Past Surgical History:  Procedure Laterality Date  . CARPAL TUNNEL RELEASE Right 07/09/2018   Procedure: RIGHT CARPAL TUNNEL RELEASE;  Surgeon: [name], MD;  Location: [facility];  Service: Orthopedics;  Laterality: Right;  . HAMMER TOE SURGERY Bilateral   . KNEE ARTHROSCOPY     both knees at different times    Current Outpatient Medications on File Prior to Visit  Medication Sig Dispense Refill  . losartan  (COZAAR) 50 MG tablet TAKE 1 TABLET BY MOUTH EVERY DAY 90 tablet 3  . meloxicam (MOBIC) 15 MG tablet Take 15 mg by mouth daily.  1   No current facility-administered medications on file prior to visit.     Allergies  Allergen Reactions  . Benazepril     Cough---09/2013   Social History   Socioeconomic History  . Marital status: Married    Spouse name: Not on file  . Number of children: Not on file  . Years of education: Not on file  . Highest education level: Not on file  Occupational History  . 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 Occupation: Teacher, English as a foreign language  Tobacco Use  . Smoking status: Never Smoker  . Smokeless tobacco: Never Used  Substance and Sexual Activity  . Alcohol use: No  . Drug use: No  . Sexual activity: Yes    Birth control/protection: Post-menopausal  Other Topics Concern  . Not on file  Social History Narrative   Married. Lives with husband.    Entered 2015--Daughter and Granddaughter live with them now.    3 children. 1 Boy: Age 77             (as of 39)                      2 Girls: 19, 64 y/o      (as of  2014)      Works in [employer].   And cleans houses.      Walks with husband every night for 30 minutes.( This was in 2014)   In 2015--pt says they walk sometimes.    She is active with granddaughter even on days they do not walk.   Social Determinants of Health   Financial Resource Strain:   . Difficulty of Paying Living Expenses: Not on file  Food Insecurity:   . Worried About Charity fundraiser in the Last Year: Not on file  . Ran Out of Food in the Last Year: Not on file  Transportation Needs:   . Lack of Transportation (Medical): Not on file  . Lack of Transportation (Non-Medical): Not on file  Physical Activity:   . Days of Exercise per Week: Not on file  . Minutes of Exercise per Session: Not on file  Stress:   . Feeling of Stress : Not on file  Social Connections:   . Frequency of  Communication with Friends and Family: Not on file  . Frequency of Social Gatherings with Friends and Family: Not on file  . Attends Religious Services: Not on file  . Active Member of Clubs or Organizations: Not on file  . Attends Archivist Meetings: Not on file  . Marital Status: Not on file  Intimate Partner Violence:   . Fear of Current or Ex-Partner: Not on file  . Emotionally Abused: Not on file  . Physically Abused: Not on file  . Sexually Abused: Not on file   Family History  Problem Relation Age of Onset  . Heart disease Father   . Diabetes Father   . Stroke Brother 50       stroke  . Colon cancer Neg Hx      Review of Systems  All other systems reviewed and are negative.      Objective:   Physical Exam Vitals reviewed.  Constitutional:      General: She is not in acute distress.    Appearance: Normal appearance. She is normal weight. She is not ill-appearing, toxic-appearing or diaphoretic.  HENT:     Head: Normocephalic and atraumatic.     Right Ear: Tympanic membrane, ear canal and external ear normal. There is no impacted cerumen.     Left Ear: Tympanic membrane, ear canal and external ear normal. There is no impacted cerumen.     Nose: Nose normal. No congestion or rhinorrhea.     Mouth/Throat:     Mouth: Mucous membranes are moist.     Pharynx: Oropharynx is clear. No oropharyngeal exudate or posterior oropharyngeal erythema.  Eyes:     General: No scleral icterus.       Right eye: No discharge.        Left eye: No discharge.     Extraocular Movements: Extraocular movements intact.     Conjunctiva/sclera: Conjunctivae normal.     Pupils: Pupils are equal, round, and reactive to light.  Neck:     Vascular: No carotid bruit.  Cardiovascular:     Rate and Rhythm: Normal rate and regular rhythm.     Pulses: Normal pulses.     Heart sounds: Normal heart sounds. No murmur. No friction rub. No gallop.   Pulmonary:     Effort: Pulmonary effort  is normal. No respiratory distress.     Breath sounds: Normal breath sounds. No stridor. No wheezing, rhonchi or rales.  Chest:  Chest wall: No tenderness.  Abdominal:     General: Abdomen is flat. Bowel sounds are normal. There is no distension.     Palpations: Abdomen is soft. There is no mass.     Tenderness: There is no abdominal tenderness. There is no right CVA tenderness, left CVA tenderness, guarding or rebound.     Hernia: No hernia is present.  Musculoskeletal:     Cervical back: Normal range of motion and neck supple. No rigidity or tenderness.     Right lower leg: No edema.     Left lower leg: No edema.  Lymphadenopathy:     Cervical: No cervical adenopathy.  Skin:    General: Skin is warm.     Coloration: Skin is not jaundiced or pale.     Findings: No bruising, erythema, lesion or rash.  Neurological:     General: No focal deficit present.     Mental Status: She is alert and oriented to person, place, and time. Mental status is at baseline.     Cranial Nerves: No cranial nerve deficit.     Sensory: No sensory deficit.     Motor: No weakness.     Coordination: Coordination normal.     Gait: Gait normal.     Deep Tendon Reflexes: Reflexes normal.  Psychiatric:        Mood and Affect: Mood normal.        Behavior: Behavior normal.        Thought Content: Thought content normal.        Judgment: Judgment normal.           Assessment & Plan:  General medical exam  Essential hypertension - Plan: CBC with Differential/Platelet, COMPLETE METABOLIC PANEL WITH GFR, Lipid panel  Encounter for preventive health examination - Plan: CBC with Differential/Platelet, COMPLETE METABOLIC PANEL WITH GFR, Lipid panel  Degeneration of L4-L5 intervertebral disc  Physical exam today is normal.  Patient was taking meloxicam for degenerated disc at L4-L5.  I recommended that she use the medication only as needed to avoid gastritis or an ulcer.  Blood pressure today is  elevated.  I will check a CMP and if her lab work is okay I would recommend adding a low-dose hydrochlorothiazide to help improve her blood pressure.  Check a fasting lipid panel and if 10-year risk of cardiovascular disease is greater than 7.5% I would recommend a statin.  Immunizations are up-to-date.  Cancer screening is up-to-date.  Mammogram is up-to-date.  Pap smear is not due until next year.  Recommended a bone density test at age 10.  I did recommend calcium 1200 mg a day and vitamin D 1000 units a day to prevent osteoporosis.  She declines hepatitis C and HIV screening.

## 2019-07-21 LAB — CBC WITH DIFFERENTIAL/PLATELET
Absolute Monocytes: 561 cells/uL (ref 200–950)
Basophils Absolute: 43 cells/uL (ref 0–200)
Basophils Relative: 0.7 %
Eosinophils Absolute: 122 cells/uL (ref 15–500)
Eosinophils Relative: 2 %
HCT: 41.2 % (ref 35.0–45.0)
Hemoglobin: 13.9 g/dL (ref 11.7–15.5)
Lymphs Abs: 982 cells/uL (ref 850–3900)
MCH: 29.4 pg (ref 27.0–33.0)
MCHC: 33.7 g/dL (ref 32.0–36.0)
MCV: 87.3 fL (ref 80.0–100.0)
MPV: 10.5 fL (ref 7.5–12.5)
Monocytes Relative: 9.2 %
Neutro Abs: 4392 cells/uL (ref 1500–7800)
Neutrophils Relative %: 72 %
Platelets: 200 10*3/uL (ref 140–400)
RBC: 4.72 10*6/uL (ref 3.80–5.10)
RDW: 13.8 % (ref 11.0–15.0)
Total Lymphocyte: 16.1 %
WBC: 6.1 10*3/uL (ref 3.8–10.8)

## 2019-07-21 LAB — LIPID PANEL
Cholesterol: 193 mg/dL (ref <200)
HDL: 68 mg/dL (ref >=50)
LDL Cholesterol (Calc): 109 mg/dL (calc) — ABNORMAL HIGH
Non-HDL Cholesterol (Calc): 125 mg/dL (calc) (ref <130)
Total CHOL/HDL Ratio: 2.8 (calc) (ref <5.0)
Triglycerides: 69 mg/dL (ref <150)

## 2019-07-21 LAB — COMPLETE METABOLIC PANEL WITH GFR
AG Ratio: 1.7 (calc) (ref 1.0–2.5)
ALT: 16 U/L (ref 6–29)
AST: 17 U/L (ref 10–35)
Albumin: 4.5 g/dL (ref 3.6–5.1)
Alkaline phosphatase (APISO): 79 U/L (ref 37–153)
BUN: 19 mg/dL (ref 7–25)
CO2: 28 mmol/L (ref 20–32)
Calcium: 10 mg/dL (ref 8.6–10.4)
Chloride: 103 mmol/L (ref 98–110)
Creat: 0.76 mg/dL (ref 0.50–0.99)
GFR, Est African American: 97 mL/min/{1.73_m2} (ref >=60)
GFR, Est Non African American: 83 mL/min/{1.73_m2} (ref >=60)
Globulin: 2.6 g/dL (calc) (ref 1.9–3.7)
Glucose, Bld: 86 mg/dL (ref 65–99)
Potassium: 4.2 mmol/L (ref 3.5–5.3)
Sodium: 141 mmol/L (ref 135–146)
Total Bilirubin: 0.9 mg/dL (ref 0.2–1.2)
Total Protein: 7.1 g/dL (ref 6.1–8.1)

## 2019-07-23 ENCOUNTER — Other Ambulatory Visit: Payer: Self-pay | Admitting: Family Medicine

## 2019-07-23 MED ORDER — HYDROCHLOROTHIAZIDE 25 MG PO TABS: 25.0000 mg | ORAL_TABLET | Freq: Every day | ORAL | 3 refills | Status: DC

## 2019-08-08 ENCOUNTER — Ambulatory Visit: Payer: BC Managed Care – PPO | Attending: Internal Medicine

## 2019-08-08 DIAGNOSIS — Z23 Encounter for immunization: Secondary | ICD-10-CM | POA: Insufficient documentation

## 2019-08-08 NOTE — Progress Notes (Signed)
   Covid-19 Vaccination Clinic  Name:  Faith Montgomery    MRN: [id number] DOB: November 09, 1955  08/08/2019  Faith Montgomery was observed post Covid-19 immunization for 15 minutes without incidence. She was provided with Vaccine Information Sheet and instruction to access the V-Safe system.   Faith Montgomery was instructed to call 911 with any severe reactions post vaccine: Marland Kitchen Difficulty breathing  . Swelling of your face and throat  . A fast heartbeat  . A bad rash all over your body  . Dizziness and weakness    Immunizations Administered    Name Date Dose VIS Date Route   Moderna COVID-19 Vaccine 08/08/2019  3:41 PM 0.5 mL 05/11/2019 Intramuscular   Manufacturer: Moderna   Lot: [id number]   [id number]

## 2019-08-19 ENCOUNTER — Ambulatory Visit (HOSPITAL_COMMUNITY)
Admission: RE | Admit: 2019-08-19 | Discharge: 2019-08-19 | Disposition: A | Discharge status: Home / Self Care | Payer: BC Managed Care – PPO | Source: Ambulatory Visit | Attending: Nurse Practitioner | Admitting: Nurse Practitioner

## 2019-08-19 ENCOUNTER — Ambulatory Visit (INDEPENDENT_AMBULATORY_CARE_PROVIDER_SITE_OTHER): Payer: BC Managed Care – PPO | Admitting: Nurse Practitioner

## 2019-08-19 ENCOUNTER — Other Ambulatory Visit: Payer: Self-pay

## 2019-08-19 ENCOUNTER — Other Ambulatory Visit: Payer: Self-pay | Admitting: Nurse Practitioner

## 2019-08-19 VITALS — BP 98/60 | HR 109 | Temp 98.4°F | Resp 18 | Ht 61.0 in | Wt 189.6 lb

## 2019-08-19 DIAGNOSIS — M79604 Pain in right leg: Secondary | ICD-10-CM | POA: Insufficient documentation

## 2019-08-19 DIAGNOSIS — M5442 Lumbago with sciatica, left side: Secondary | ICD-10-CM

## 2019-08-19 DIAGNOSIS — M5136 Other intervertebral disc degeneration, lumbar region: Secondary | ICD-10-CM

## 2019-08-19 NOTE — Progress Notes (Signed)
Acute Office Visit  Subjective:    Patient ID: Faith Montgomery, female    DOB: 1956-03-05, 64 y.o.   MRN: [id number]  Chief Complaint: Upper Right thigh pain  HPI Patient is in today for Upper Right thigh pain. The pain is described as a knife stabbing pain that comes on quickly then resolves quickly. The sxs started 2 weeks ago and she did complete an appointment with orthopedic surgeon for her right knee replacement in Nov 2021 to which she was told was not related and to follow up with PCP. She reports that the sxs are aggravated by sudden movements however may occur while driving. No other associated sxs such as hot spot, increase or decrease in temperature of leg, extremity, or foot. No numbness, tingling, or weakness. She does report that she has avoided activity that require using the right leg as she feels an episode will occur. No treatments tried.  She is due to have regular labs drawn on Monday therefore will not have potassium checked today. Will have stat image today to rule out DVT.   Past Medical History:  Diagnosis Date  . Hypertension   . Right carpal tunnel syndrome 04/22/2018    Past Surgical History:  Procedure Laterality Date  . CARPAL TUNNEL RELEASE Right 07/09/2018   Procedure: RIGHT CARPAL TUNNEL RELEASE;  Surgeon: [name], MD;  Location: [place];  Service: Orthopedics;  Laterality: Right;  . HAMMER TOE SURGERY Bilateral   . KNEE ARTHROSCOPY     both knees at different times    Family History  Problem Relation Age of Onset  . Heart disease Father   . Diabetes Father   . Stroke Brother 50       stroke  . Colon cancer Neg Hx     Social History   Socioeconomic History  . Marital status: Married    Spouse name: Not on file  . Number of children: Not on file  . Years of education: Not on file  . Highest education level: Not on file  Occupational History  . 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  Occupation: Teacher, English as a foreign language  Tobacco Use  . Smoking status: Never Smoker  . Smokeless tobacco: Never Used  Substance and Sexual Activity  . Alcohol use: No  . Drug use: No  . Sexual activity: Yes    Birth control/protection: Post-menopausal  Other Topics Concern  . Not on file  Social History Narrative   Married. Lives with husband.    Entered 2015--Daughter and Granddaughter live with them now.    3 children. 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      Walks with husband every night for 30 minutes.( This was in 2014)   In 2015--pt says they walk sometimes.    She is active with granddaughter even on days they do not walk.   Social Determinants of Health   Financial Resource Strain:   . Difficulty of Paying Living Expenses:   Food Insecurity:   . Worried About Charity fundraiser in the Last Year:   . [employer]  in the Last Year:   Transportation Needs:   . Film/video editor (Medical):   Marland Kitchen Lack of Transportation (Non-Medical):   Physical Activity:   . Days of Exercise per Week:   . Minutes of Exercise per Session:   Stress:   . Feeling of Stress :   Social Connections:   . Frequency of Communication with Friends and Family:   . Frequency of Social Gatherings with Friends and Family:   . Attends Religious Services:   . Active Member of Clubs or Organizations:   . Attends Archivist Meetings:   Marland Kitchen Marital Status:   Intimate Partner Violence:   . Fear of Current or Ex-Partner:   . Emotionally Abused:   Marland Kitchen Physically Abused:   . Sexually Abused:     Outpatient Medications Prior to Visit  Medication Sig Dispense Refill  . calcium carbonate (OS-CAL - DOSED IN MG OF ELEMENTAL CALCIUM) 1250 (500 Ca) MG tablet Take 1 tablet by mouth.    . calcium-vitamin D (OSCAL WITH D) 500-200 MG-UNIT tablet Take 1 tablet by mouth.    . hydrochlorothiazide  (HYDRODIURIL) 25 MG tablet Take 1 tablet (25 mg total) by mouth daily. 90 tablet 3  . losartan (COZAAR) 50 MG tablet TAKE 1 TABLET BY MOUTH EVERY DAY 90 tablet 3  . meloxicam (MOBIC) 15 MG tablet Take 15 mg by mouth daily.  1   No facility-administered medications prior to visit.    Allergies  Allergen Reactions  . Benazepril     Cough---09/2013    Review of Systems  All other systems reviewed and are negative.      Objective:    Physical Exam Vitals and nursing note reviewed.  Constitutional:      Appearance: Normal appearance. She is well-developed and well-groomed.  HENT:     Head: Normocephalic.     Right Ear: Tympanic membrane, ear canal and external ear normal.     Left Ear: Tympanic membrane, ear canal and external ear normal.  Eyes:     General: Lids are normal. Lids are everted, no foreign bodies appreciated.     Extraocular Movements: Extraocular movements intact.     Conjunctiva/sclera: Conjunctivae normal.     Pupils: Pupils are equal, round, and reactive to light.  Cardiovascular:     Rate and Rhythm: Normal rate.     Pulses: Normal pulses. No decreased pulses.  Pulmonary:     Effort: Pulmonary effort is normal.  Musculoskeletal:        General: Normal range of motion.     Cervical back: Normal range of motion and neck supple.     Right lower leg: No edema.     Left lower leg: No edema.       Legs:     Comments: Stabbing pain in this region- not present at time of visit. No erythema, abnormal temperature, no swelling, pulses normal to entire extremity  Skin:    Capillary Refill: Capillary refill takes less than 2 seconds.  Neurological:     General: No focal deficit present.     Mental Status: She is alert and oriented to person, place, and time.  Psychiatric:        Attention and Perception: Attention normal.        Mood and Affect: Mood normal.        Speech: Speech normal.        Behavior: Behavior normal. Behavior is cooperative.         Cognition  and Memory: Cognition normal.        Judgment: Judgment normal.     BP 98/60 (BP Location: Left Arm, Patient Position: Sitting, Cuff Size: Large)   Pulse (!) 109   Temp 98.4 F (36.9 C) (Oral)   Resp 18   Ht 5\' 1"  (1.549 m)   Wt 189 lb 9.6 oz (86 kg)   SpO2 96%   BMI 35.82 kg/m  Wt Readings from Last 3 Encounters:  08/19/19 189 lb 9.6 oz (86 kg)  07/20/19 193 lb (87.5 kg)  10/13/18 181 lb (82.1 kg)    Health Maintenance Due  Topic Date Due  . Hepatitis C Screening  Never done  . HIV Screening  Never done    Lab Results  Component Value Date   TSH 0.93 03/12/2018   Lab Results  Component Value Date   WBC 6.1 07/20/2019   HGB 13.9 07/20/2019   HCT 41.2 07/20/2019   MCV 87.3 07/20/2019   PLT 200 07/20/2019   Lab Results  Component Value Date   NA 141 07/20/2019   K 4.2 07/20/2019   CO2 28 07/20/2019   GLUCOSE 86 07/20/2019   BUN 19 07/20/2019   CREATININE 0.76 07/20/2019   BILITOT 0.9 07/20/2019   ALKPHOS 63 04/19/2016   AST 17 07/20/2019   ALT 16 07/20/2019   PROT 7.1 07/20/2019   ALBUMIN 4.0 04/19/2016   CALCIUM 10.0 07/20/2019   Lab Results  Component Value Date   CHOL 193 07/20/2019   Lab Results  Component Value Date   HDL 68 07/20/2019   Lab Results  Component Value Date   LDLCALC 109 (H) 07/20/2019   Lab Results  Component Value Date   TRIG 69 07/20/2019   Lab Results  Component Value Date   CHOLHDL 2.8 07/20/2019   Lab Results  Component Value Date   HGBA1C 6.1 (H) 03/12/2018      Assessment & Plan:    Problem List Items Addressed This Visit    None    Visit Diagnoses    Right leg pain    -  Primary   Relevant Orders   US Venous Img Lower Unilateral Right     Follow up for non resolving or worsening symptoms. Keep your upcoming appointments next week.  Annie Main, FNP

## 2019-08-19 NOTE — Progress Notes (Signed)
Let pt know no DVT.

## 2019-08-20 ENCOUNTER — Encounter: Payer: Self-pay | Admitting: Family Medicine

## 2019-08-23 ENCOUNTER — Other Ambulatory Visit: Payer: Self-pay

## 2019-08-23 ENCOUNTER — Other Ambulatory Visit: Payer: BC Managed Care – PPO

## 2019-08-23 DIAGNOSIS — I10 Essential (primary) hypertension: Secondary | ICD-10-CM

## 2019-08-24 ENCOUNTER — Other Ambulatory Visit: Payer: Self-pay | Admitting: Nurse Practitioner

## 2019-08-24 DIAGNOSIS — M79604 Pain in right leg: Secondary | ICD-10-CM

## 2019-08-24 LAB — COMPREHENSIVE METABOLIC PANEL
AG Ratio: 1.7 (calc) (ref 1.0–2.5)
ALT: 10 U/L (ref 6–29)
AST: 13 U/L (ref 10–35)
Albumin: 4.2 g/dL (ref 3.6–5.1)
Alkaline phosphatase (APISO): 70 U/L (ref 37–153)
BUN: 20 mg/dL (ref 7–25)
CO2: 31 mmol/L (ref 20–32)
Calcium: 9.9 mg/dL (ref 8.6–10.4)
Chloride: 101 mmol/L (ref 98–110)
Creat: 0.72 mg/dL (ref 0.50–0.99)
Globulin: 2.5 g/dL (calc) (ref 1.9–3.7)
Glucose, Bld: 98 mg/dL (ref 65–99)
Potassium: 3.8 mmol/L (ref 3.5–5.3)
Sodium: 140 mmol/L (ref 135–146)
Total Bilirubin: 0.6 mg/dL (ref 0.2–1.2)
Total Protein: 6.7 g/dL (ref 6.1–8.1)

## 2019-09-11 ENCOUNTER — Ambulatory Visit: Payer: BC Managed Care – PPO | Attending: Internal Medicine

## 2019-09-11 DIAGNOSIS — Z23 Encounter for immunization: Secondary | ICD-10-CM

## 2019-09-11 NOTE — Progress Notes (Signed)
   Covid-19 Vaccination Clinic  Name:  LORENZA PROPES    MRN: [id number] DOB: [birth year]  09/11/2019  Ms. Rydman was observed post Covid-19 immunization for 15 minutes without incident. She was provided with Vaccine Information Sheet and instruction to access the V-Safe system.   Ms. Amey was instructed to call 911 with any severe reactions post vaccine: Marland Kitchen Difficulty breathing  . Swelling of face and throat  . A fast heartbeat  . A bad rash all over body  . Dizziness and weakness   Immunizations Administered    Name Date Dose VIS Date Route   Moderna COVID-19 Vaccine 09/11/2019  3:33 PM 0.5 mL 05/11/2019 Intramuscular   Manufacturer: Moderna   Lot: [id number]   [id number]

## 2020-04-30 IMAGING — MG MM DIGITAL DIAGNOSTIC UNILAT*L* W/ TOMO W/ CAD
4 series · 4 of 12 positions shown · non-contrast
Comparison: Previous exam(s).

CLINICAL DATA: Screening recall for asymmetry seen in the left
breast on the cc view only.

EXAM:
DIGITAL DIAGNOSTIC UNILATERAL LEFT MAMMOGRAM WITH CAD AND TOMO

[L ML synth-2D]
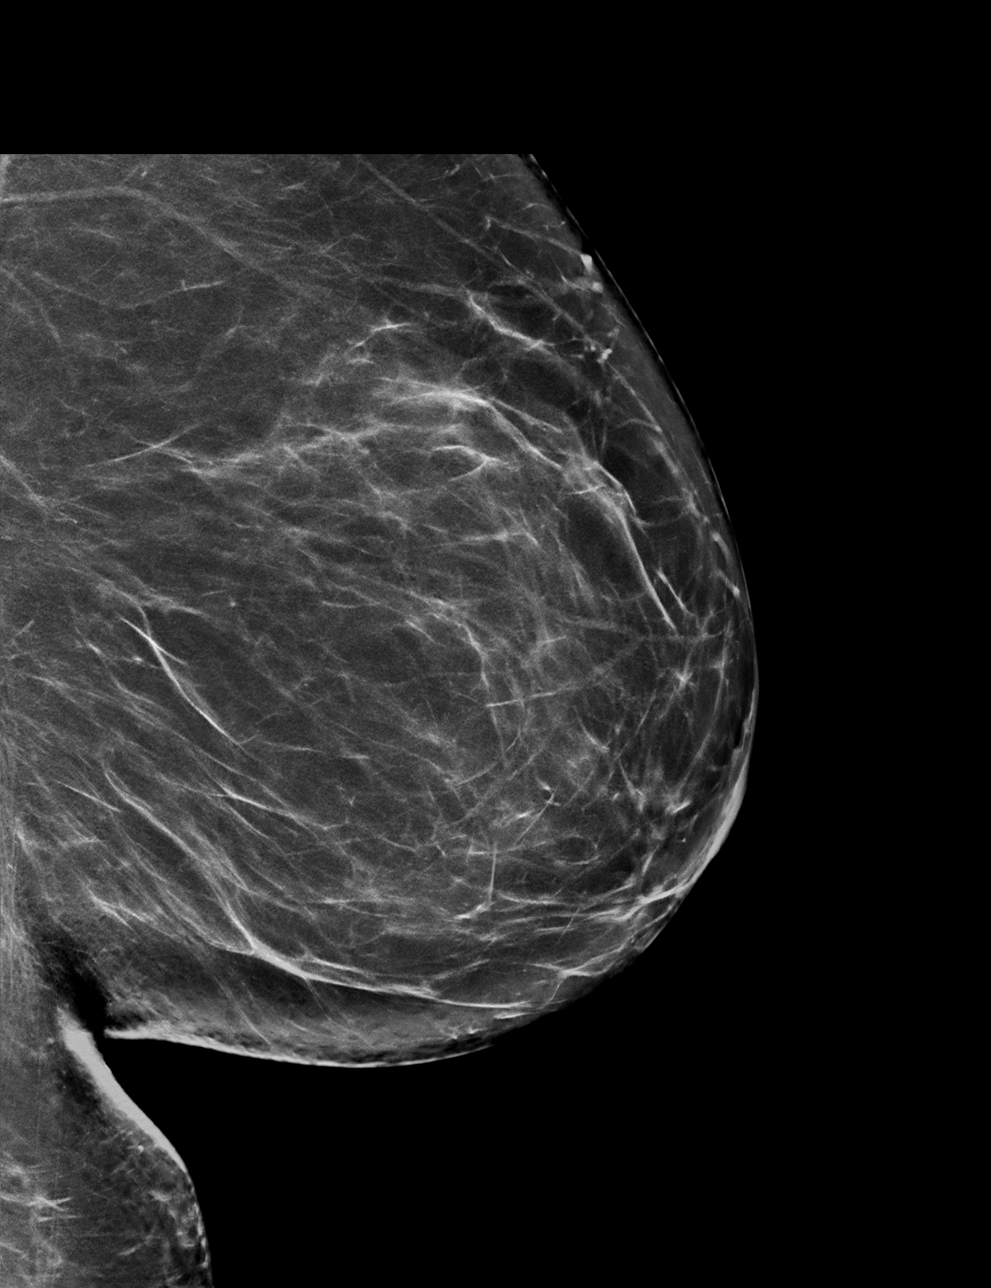

[L CC synth-2D]
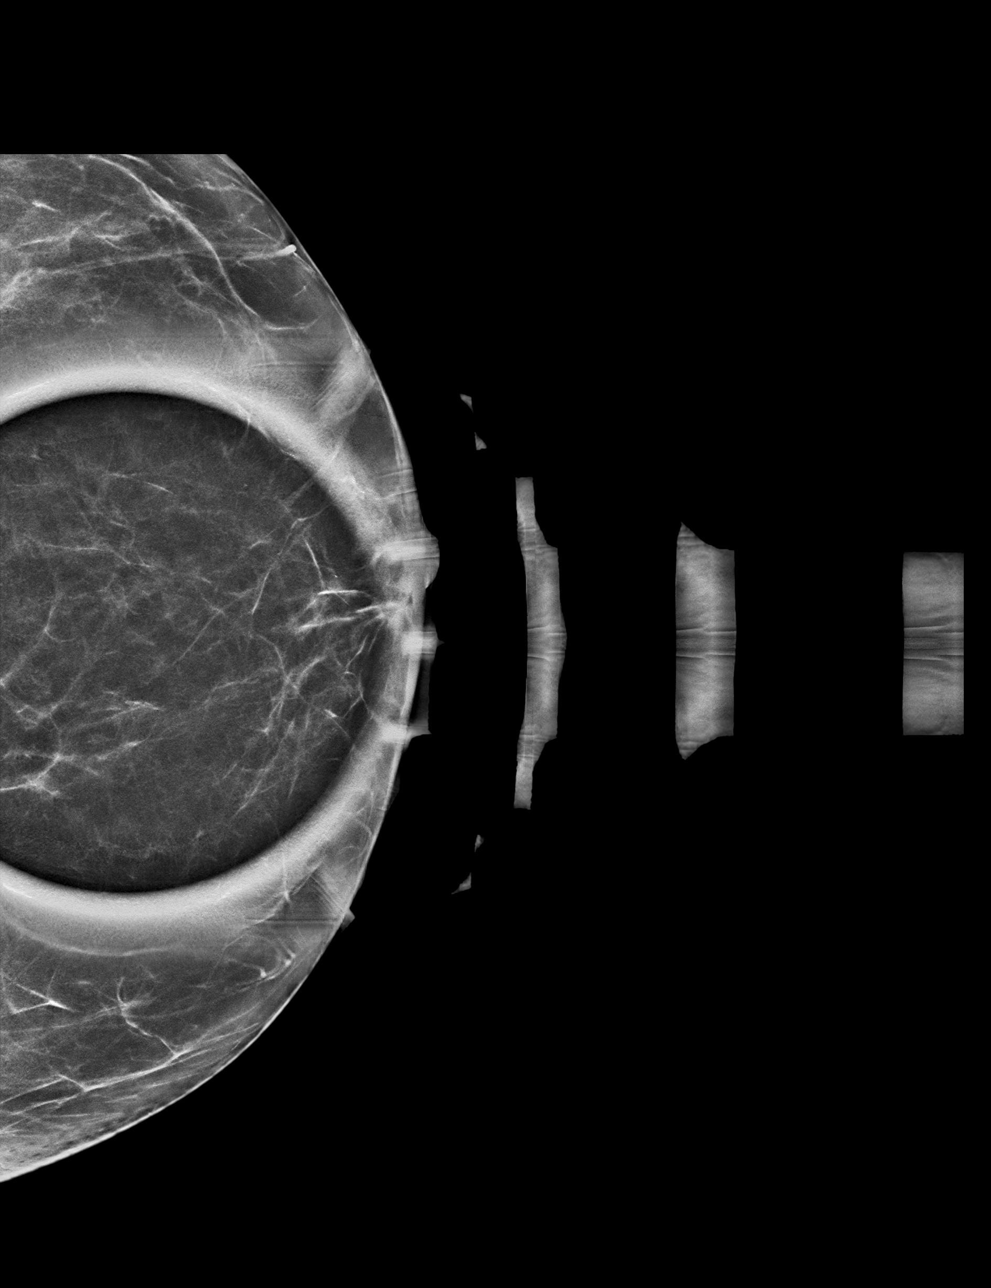

[L ML tomo · tomo slice 41/81.0]
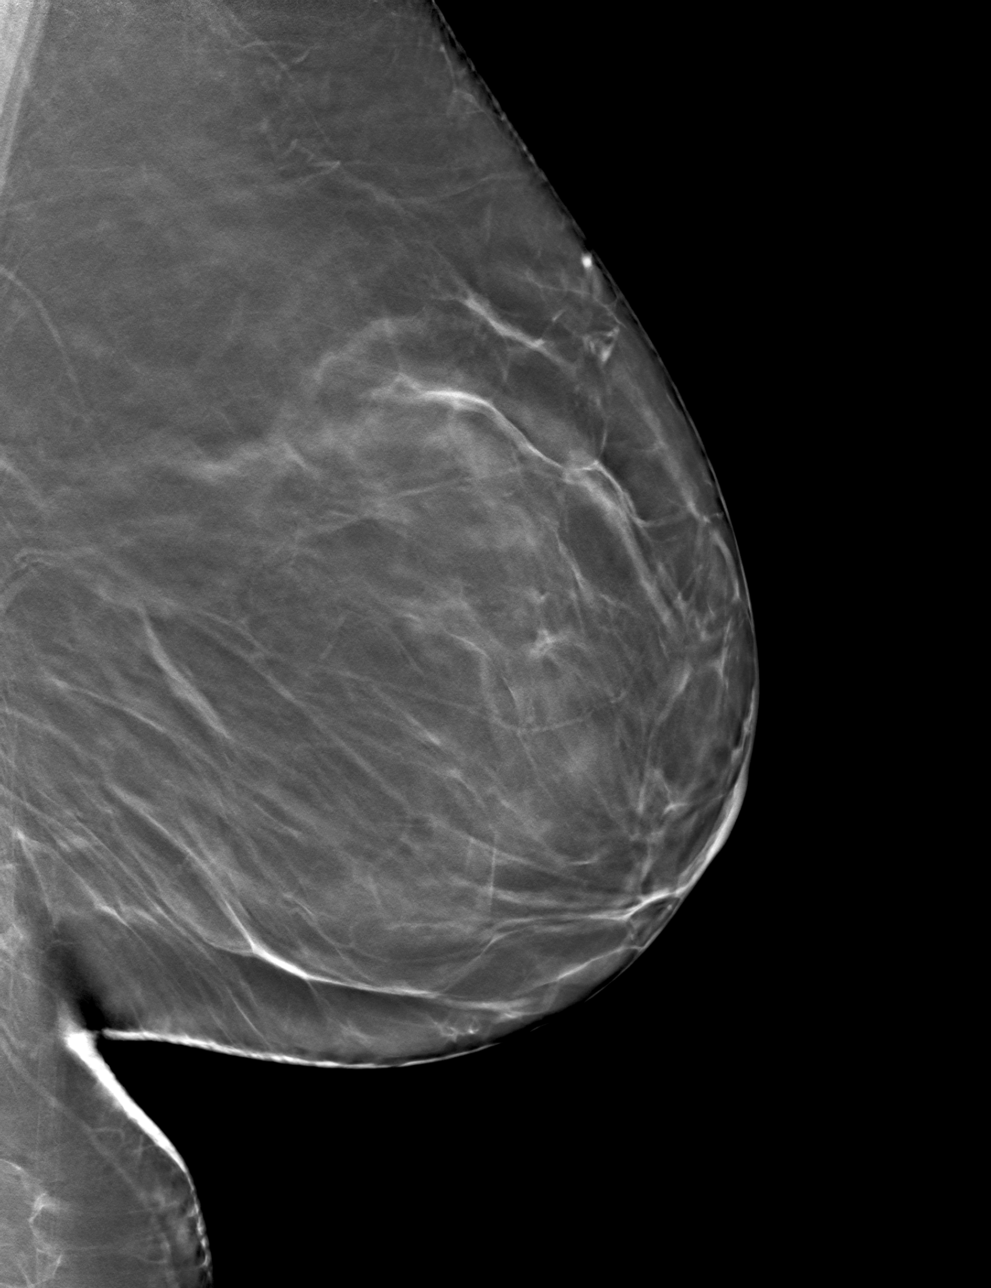

[L CC tomo · tomo slice 27/54.0]
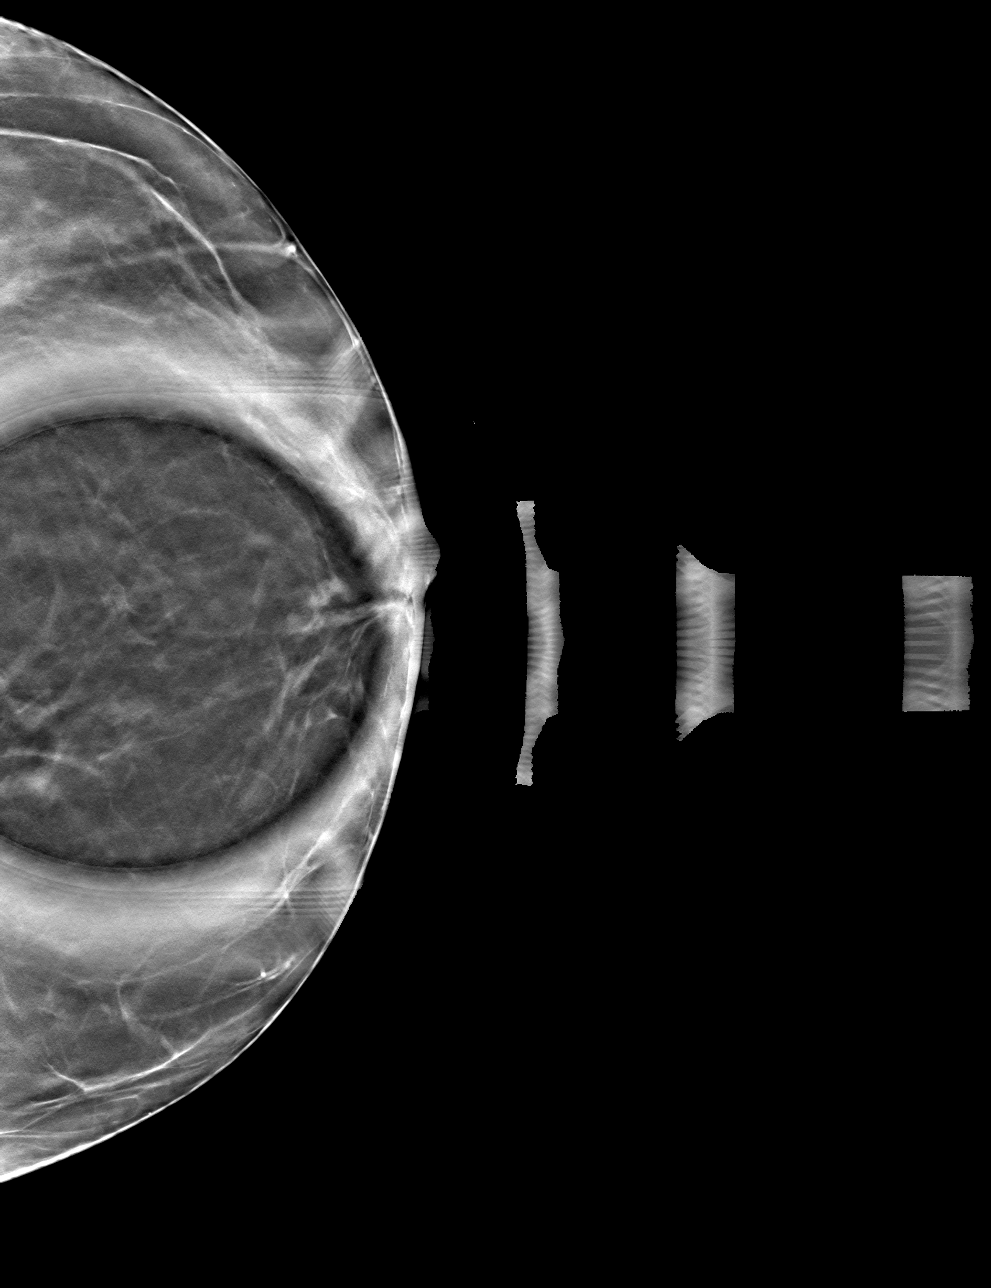

[4 of 12 positions shown; findings below may reference images not displayed]

ACR Breast Density Category b: There are scattered areas of
fibroglandular density.
FINDINGS: Additional tomograms were performed of the left breast. There are no
persistent suspicious masses or abnormalities seen in the left
breast, with the initially questioned possible left breast asymmetry
unchanged in appearance dating back to 2441 in therefore considered
benign. There is no mammographic evidence of malignancy in the left
breast.

Mammographic images were processed with CAD.
IMPRESSION: No mammographic evidence of malignancy in the left breast.

RECOMMENDATION:
Screening mammogram in one year.(Code:[id number]-549)

I have discussed the findings and recommendations with the patient.
If applicable, a reminder letter will be sent to the patient
regarding the next appointment.

BI-RADS CATEGORY  1: Negative.

## 2020-05-23 ENCOUNTER — Ambulatory Visit: Payer: BC Managed Care – PPO | Admitting: Family Medicine

## 2020-05-23 ENCOUNTER — Other Ambulatory Visit: Payer: Self-pay

## 2020-05-23 VITALS — BP 120/70 | HR 94 | Temp 98.1°F | Ht 61.0 in | Wt 183.0 lb

## 2020-05-23 DIAGNOSIS — I10 Essential (primary) hypertension: Secondary | ICD-10-CM

## 2020-05-23 NOTE — Progress Notes (Signed)
Subjective:    Patient ID: Faith Montgomery, female    DOB: 04/15/1956, 64 y.o.   MRN: [id number]  HPI  Patient is a very pleasant 64 year old Caucasian female who is coming in today for preoperative surgical clearance.  Past medical history significant for hypertension.  She denies any angina.  She denies any dyspnea on exertion.  She denies any orthopnea or paroxysmal nocturnal dyspnea.  She denies any cardiac arrhythmias or syncope or near syncope.  Blood pressure today is excellent 120/70.  She has no history of anemia or kidney failure.  She has had her flu shot as well as her Covid vaccine.  Past Medical History:  Diagnosis Date  . Hypertension   . Right carpal tunnel syndrome 04/22/2018   Past Surgical History:  Procedure Laterality Date  . CARPAL TUNNEL RELEASE Right 07/09/2018   Procedure: RIGHT CARPAL TUNNEL RELEASE;  Surgeon: [name], MD;  Location: [place];  Service: Orthopedics;  Laterality: Right;  . HAMMER TOE SURGERY Bilateral   . KNEE ARTHROSCOPY     both knees at different times    Current Outpatient Medications on File Prior to Visit  Medication Sig Dispense Refill  . calcium carbonate (OS-CAL - DOSED IN MG OF ELEMENTAL CALCIUM) 1250 (500 Ca) MG tablet Take 1 tablet by mouth.    . calcium-vitamin D (OSCAL WITH D) 500-200 MG-UNIT tablet Take 1 tablet by mouth.    . hydrochlorothiazide (HYDRODIURIL) 25 MG tablet Take 1 tablet (25 mg total) by mouth daily. 90 tablet 3  . losartan (COZAAR) 50 MG tablet TAKE 1 TABLET BY MOUTH EVERY DAY 90 tablet 3  . meloxicam (MOBIC) 15 MG tablet Take 15 mg by mouth daily. (Patient not taking: Reported on 05/23/2020)  1   No current facility-administered medications on file prior to visit.     Allergies  Allergen Reactions  . Benazepril     Cough---09/2013   Social History   Socioeconomic History  . Marital status: Married    Spouse name: Not on file  . Number of children: Not on file  . Years of  education: Not on file  . Highest education level: Not on file  Occupational History  . 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 Occupation: Teacher, English as a foreign language  Tobacco Use  . Smoking status: Never Smoker  . Smokeless tobacco: Never Used  Substance and Sexual Activity  . Alcohol use: No  . Drug use: No  . Sexual activity: Yes    Birth control/protection: Post-menopausal  Other Topics Concern  . Not on file  Social History Narrative   Married. Lives with husband.    Entered 2015--Daughter and Granddaughter live with them now.    3 children. 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      Walks with husband every night for 30 minutes.( This was in 2014)   In 2015--pt says they walk sometimes.    She is active with granddaughter even on days they do not walk.   Social Determinants of Health   Financial Resource Strain: Not on file  Food Insecurity: Not on file  Transportation Needs: Not on file  Physical Activity: Not on file  Stress: Not on file  Social Connections: Not on file  Intimate Partner Violence: Not on file   Family History  Problem Relation Age of Onset  . Heart disease Father   . Diabetes Father   . Stroke Brother 50       stroke  . Colon cancer Neg Hx      Review of Systems  All other systems reviewed and are negative.      Objective:   Physical Exam Vitals reviewed.  Constitutional:      General: She is not in acute distress.    Appearance: Normal appearance. She is normal weight. She is not ill-appearing, toxic-appearing or diaphoretic.  HENT:     Head: Normocephalic and atraumatic.     Right Ear: Tympanic membrane, ear canal and external ear normal. There is no impacted cerumen.     Left Ear: Tympanic membrane, ear canal and external ear normal. There is no impacted cerumen.     Nose: Nose normal. No congestion or  rhinorrhea.     Mouth/Throat:     Mouth: Mucous membranes are moist.     Pharynx: Oropharynx is clear. No oropharyngeal exudate or posterior oropharyngeal erythema.  Eyes:     General: No scleral icterus.       Right eye: No discharge.        Left eye: No discharge.     Extraocular Movements: Extraocular movements intact.     Conjunctiva/sclera: Conjunctivae normal.     Pupils: Pupils are equal, round, and reactive to light.  Neck:     Vascular: No carotid bruit.  Cardiovascular:     Rate and Rhythm: Normal rate and regular rhythm.     Pulses: Normal pulses.     Heart sounds: Normal heart sounds. No murmur heard. No friction rub. No gallop.   Pulmonary:     Effort: Pulmonary effort is normal. No respiratory distress.     Breath sounds: Normal breath sounds. No stridor. No wheezing, rhonchi or rales.  Chest:     Chest wall: No tenderness.  Abdominal:     General: Abdomen is flat. Bowel sounds are normal. There is no distension.     Palpations: Abdomen is soft. There is no mass.     Tenderness: There is no abdominal tenderness. There is no right CVA tenderness, left CVA tenderness, guarding or rebound.     Hernia: No hernia is present.  Musculoskeletal:     Cervical back: Normal range of motion and neck supple. No rigidity or tenderness.     Right lower leg: No edema.     Left lower leg: No edema.  Lymphadenopathy:     Cervical: No cervical adenopathy.  Skin:    General: Skin is warm.     Coloration: Skin is not jaundiced or pale.     Findings: No bruising, erythema, lesion or rash.  Neurological:     General: No focal deficit present.     Mental Status: She is alert and oriented to person, place, and time. Mental status is at baseline.     Cranial Nerves: No cranial nerve deficit.     Sensory: No sensory deficit.     Motor: No weakness.     Coordination: Coordination normal.     Gait: Gait normal.     Deep Tendon Reflexes: Reflexes normal.  Psychiatric:        Mood and  Affect: Mood normal.  Behavior: Behavior normal.        Thought Content: Thought content normal.        Judgment: Judgment normal.           Assessment & Plan:  Benign essential HTN - Plan: CBC with Differential/Platelet, COMPLETE METABOLIC PANEL WITH GFR  Patient has no signs of unstable angina or undiagnosed congestive heart failure.  Her blood pressure is excellent.  She is fully vaccinated.  I will check a CBC to rule out severe anemia and I will check a CMP to rule out any electrolyte abnormalities that would make anesthesia dangerous.  Assuming the lab work is normal, the patient will be medically cleared to proceed with her upcoming elective knee replacement.

## 2020-05-24 LAB — COMPLETE METABOLIC PANEL WITH GFR
AG Ratio: 1.7 (calc) (ref 1.0–2.5)
ALT: 10 U/L (ref 6–29)
AST: 14 U/L (ref 10–35)
Albumin: 4.3 g/dL (ref 3.6–5.1)
Alkaline phosphatase (APISO): 74 U/L (ref 37–153)
BUN: 14 mg/dL (ref 7–25)
CO2: 31 mmol/L (ref 20–32)
Calcium: 9.8 mg/dL (ref 8.6–10.4)
Chloride: 99 mmol/L (ref 98–110)
Creat: 0.63 mg/dL (ref 0.50–0.99)
GFR, Est African American: 110 mL/min/{1.73_m2} (ref >=60)
GFR, Est Non African American: 95 mL/min/{1.73_m2} (ref >=60)
Globulin: 2.5 g/dL (calc) (ref 1.9–3.7)
Glucose, Bld: 107 mg/dL — ABNORMAL HIGH (ref 65–99)
Potassium: 3.6 mmol/L (ref 3.5–5.3)
Sodium: 139 mmol/L (ref 135–146)
Total Bilirubin: 0.6 mg/dL (ref 0.2–1.2)
Total Protein: 6.8 g/dL (ref 6.1–8.1)

## 2020-05-24 LAB — CBC WITH DIFFERENTIAL/PLATELET
Absolute Monocytes: 585 cells/uL (ref 200–950)
Basophils Absolute: 48 cells/uL (ref 0–200)
Basophils Relative: 0.7 %
Eosinophils Absolute: 245 cells/uL (ref 15–500)
Eosinophils Relative: 3.6 %
HCT: 39.9 % (ref 35.0–45.0)
Hemoglobin: 13.8 g/dL (ref 11.7–15.5)
Lymphs Abs: 1108 cells/uL (ref 850–3900)
MCH: 30.7 pg (ref 27.0–33.0)
MCHC: 34.6 g/dL (ref 32.0–36.0)
MCV: 88.9 fL (ref 80.0–100.0)
MPV: 10.4 fL (ref 7.5–12.5)
Monocytes Relative: 8.6 %
Neutro Abs: 4814 cells/uL (ref 1500–7800)
Neutrophils Relative %: 70.8 %
Platelets: 204 10*3/uL (ref 140–400)
RBC: 4.49 10*6/uL (ref 3.80–5.10)
RDW: 12.5 % (ref 11.0–15.0)
Total Lymphocyte: 16.3 %
WBC: 6.8 10*3/uL (ref 3.8–10.8)

## 2020-05-25 ENCOUNTER — Encounter: Payer: Self-pay | Admitting: Family Medicine

## 2020-06-15 ENCOUNTER — Other Ambulatory Visit (HOSPITAL_COMMUNITY): Payer: Self-pay | Admitting: Family Medicine

## 2020-06-15 DIAGNOSIS — Z1231 Encounter for screening mammogram for malignant neoplasm of breast: Secondary | ICD-10-CM

## 2020-07-17 ENCOUNTER — Other Ambulatory Visit: Payer: Self-pay | Admitting: Family Medicine

## 2020-07-20 ENCOUNTER — Ambulatory Visit: Payer: BC Managed Care – PPO | Admitting: Family Medicine

## 2020-07-20 ENCOUNTER — Other Ambulatory Visit: Payer: Self-pay

## 2020-07-20 ENCOUNTER — Encounter: Payer: Self-pay | Admitting: Family Medicine

## 2020-07-20 VITALS — BP 122/64 | HR 90 | Temp 98.1°F | Resp 16 | Ht 61.0 in | Wt 188.0 lb

## 2020-07-20 DIAGNOSIS — I10 Essential (primary) hypertension: Secondary | ICD-10-CM | POA: Diagnosis not present

## 2020-07-20 DIAGNOSIS — Z0001 Encounter for general adult medical examination with abnormal findings: Secondary | ICD-10-CM

## 2020-07-20 DIAGNOSIS — Z0000 Encounter for general adult medical examination without abnormal findings: Secondary | ICD-10-CM

## 2020-07-20 LAB — LIPID PANEL
Cholesterol: 185 mg/dL (ref <200)
HDL: 56 mg/dL (ref >=50)
LDL Cholesterol (Calc): 107 mg/dL (calc) — ABNORMAL HIGH
Non-HDL Cholesterol (Calc): 129 mg/dL (calc) (ref <130)
Total CHOL/HDL Ratio: 3.3 (calc) (ref <5.0)
Triglycerides: 127 mg/dL (ref <150)

## 2020-07-20 NOTE — Progress Notes (Signed)
Subjective:    Patient ID: Faith Montgomery, female    DOB: 1955/11/16, 65 y.o.   MRN: [id number]  HPI  Patient is a very pleasant 65 year old Caucasian female here today for complete physical exam.  Since I last saw the patient, she had a left total knee replacement.  She is still in physical therapy for this and having a little bit of difficulty walking and climbing steps and still having a fair amount of pain however she feels that she is getting better slowly.  Her mammogram has been scheduled for later this month.  Her last colonoscopy was in 2016.  There was no sign of any tubular adenoma and so she has been cleared until 2026.  She is due for a bone density test next year along with Pneumovax 23.  She denies any family history of osteoporosis or osteopenia.  Her last Pap smear was in 2019.  She is due this year.  However she would like to defer that today just due to the pain in her knee.  She does not feel that she can get in the lithotomy position comfortably because of her knee at the present time and she would prefer to schedule this later.  She is had all of her Covid vaccinations and flu shots.  The only vaccine she is due for at the present time is the shingles vaccine.  Blood pressure today is outstanding at 122/64  Immunization History  Administered Date(s) Administered  . Influenza,inj,Quad PF,6+ Mos 03/25/2013, 03/28/2014, 04/05/2015, 04/10/2016, 03/25/2017, 03/12/2018  . Influenza-Unspecified 03/25/2012, 03/11/2019  . Moderna Sars-Covid-2 Vaccination 08/08/2019, 09/11/2019  . Td 03/25/2012  . Tdap 03/25/2012    Past Medical History:  Diagnosis Date  . Hypertension   . Right carpal tunnel syndrome 04/22/2018   Past Surgical History:  Procedure Laterality Date  . CARPAL TUNNEL RELEASE Right 07/09/2018   Procedure: RIGHT CARPAL TUNNEL RELEASE;  Surgeon: [name], MD;  Location: [place];  Service: Orthopedics;  Laterality: Right;  . HAMMER TOE SURGERY  Bilateral   . KNEE ARTHROSCOPY     both knees at different times    Current Outpatient Medications on File Prior to Visit  Medication Sig Dispense Refill  . calcium carbonate (OS-CAL - DOSED IN MG OF ELEMENTAL CALCIUM) 1250 (500 Ca) MG tablet Take 1 tablet by mouth.    . calcium-vitamin D (OSCAL WITH D) 500-200 MG-UNIT tablet Take 1 tablet by mouth.    . hydrochlorothiazide (HYDRODIURIL) 25 MG tablet TAKE 1 TABLET BY MOUTH EVERY DAY 90 tablet 3  . losartan (COZAAR) 50 MG tablet TAKE 1 TABLET BY MOUTH EVERY DAY 90 tablet 3  . meloxicam (MOBIC) 15 MG tablet Take 15 mg by mouth daily. (Patient not taking: Reported on 05/23/2020)  1   No current facility-administered medications on file prior to visit.     Allergies  Allergen Reactions  . Benazepril     Cough---09/2013   Social History   Socioeconomic History  . Marital status: Married    Spouse name: Not on file  . Number of children: Not on file  . Years of education: Not on file  . Highest education level: Not on file  Occupational History  . 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 Occupation: Teacher, English as a foreign language  Tobacco Use  . Smoking status: Never Smoker  . Smokeless tobacco: Never Used  Substance and Sexual Activity  . Alcohol use: No  . Drug use: No  .  Sexual activity: Yes    Birth control/protection: Post-menopausal  Other Topics Concern  . Not on file  Social History Narrative   Married. Lives with husband.    Entered 2015--Daughter and Granddaughter live with them now.    3 children. 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      Walks with husband every night for 30 minutes.( This was in 2014)   In 2015--pt says they walk sometimes.    She is active with granddaughter even on days they do not walk.   Social Determinants of Health   Financial Resource Strain: Not on file   Food Insecurity: Not on file  Transportation Needs: Not on file  Physical Activity: Not on file  Stress: Not on file  Social Connections: Not on file  Intimate Partner Violence: Not on file   Family History  Problem Relation Age of Onset  . Heart disease Father   . Diabetes Father   . Stroke Brother 50       stroke  . Colon cancer Neg Hx      Review of Systems  All other systems reviewed and are negative.      Objective:   Physical Exam Vitals reviewed.  Constitutional:      General: She is not in acute distress.    Appearance: Normal appearance. She is normal weight. She is not ill-appearing, toxic-appearing or diaphoretic.  HENT:     Head: Normocephalic and atraumatic.     Right Ear: Tympanic membrane, ear canal and external ear normal. There is no impacted cerumen.     Left Ear: Tympanic membrane, ear canal and external ear normal. There is no impacted cerumen.     Nose: Nose normal. No congestion or rhinorrhea.     Mouth/Throat:     Mouth: Mucous membranes are moist.     Pharynx: Oropharynx is clear. No oropharyngeal exudate or posterior oropharyngeal erythema.  Eyes:     General: No scleral icterus.       Right eye: No discharge.        Left eye: No discharge.     Extraocular Movements: Extraocular movements intact.     Conjunctiva/sclera: Conjunctivae normal.     Pupils: Pupils are equal, round, and reactive to light.  Neck:     Vascular: No carotid bruit.  Cardiovascular:     Rate and Rhythm: Normal rate and regular rhythm.     Pulses: Normal pulses.     Heart sounds: Normal heart sounds. No murmur heard. No friction rub. No gallop.   Pulmonary:     Effort: Pulmonary effort is normal. No respiratory distress.     Breath sounds: Normal breath sounds. No stridor. No wheezing, rhonchi or rales.  Chest:     Chest wall: No tenderness.  Abdominal:     General: Abdomen is flat. Bowel sounds are normal. There is no distension.     Palpations: Abdomen is soft.  There is no mass.     Tenderness: There is no abdominal tenderness. There is no right CVA tenderness, left CVA tenderness, guarding or rebound.     Hernia: No hernia is present.  Musculoskeletal:     Cervical back: Normal range of motion and neck supple. No rigidity or tenderness.     Right lower leg: No edema.     Left lower leg: No edema.  Lymphadenopathy:     Cervical: No cervical adenopathy.  Skin:    General: Skin is warm.     Coloration: Skin is not jaundiced or pale.     Findings: No bruising, erythema, lesion or rash.  Neurological:     General: No focal deficit present.     Mental Status: She is alert and oriented to person, place, and time. Mental status is at baseline.     Cranial Nerves: No cranial nerve deficit.     Sensory: No sensory deficit.     Motor: No weakness.     Coordination: Coordination normal.     Gait: Gait normal.     Deep Tendon Reflexes: Reflexes normal.  Psychiatric:        Mood and Affect: Mood normal.        Behavior: Behavior normal.        Thought Content: Thought content normal.        Judgment: Judgment normal.           Assessment & Plan:  General medical exam  Benign essential HTN - Plan: Lipid panel  Blood pressure today is excellent.  I encouraged the patient to get the shingles shot at her earliest convenience.  Flu shot and Covid shot are up-to-date.  I would recommend Pneumovax 23 next year.  I would recommend a bone density test next year.  Colonoscopy is not due until 2026.  She is due for a Pap smear at her earliest convenience but she would like to defer that at the present time until her knee feels better just due to her decreased range of motion and pain that she is having with mobility.  I will be glad to perform a Pap smear at her earliest convenience.  I did recommend the shingles vaccine.  Check a fasting lipid panel.  CBC and CMP were checked in December and were normal.

## 2020-07-21 ENCOUNTER — Encounter: Payer: Self-pay | Admitting: *Deleted

## 2020-08-07 ENCOUNTER — Ambulatory Visit (HOSPITAL_COMMUNITY)
Admission: RE | Admit: 2020-08-07 | Discharge: 2020-08-07 | Disposition: A | Discharge status: Home / Self Care | Payer: BC Managed Care – PPO | Source: Ambulatory Visit | Attending: Family Medicine | Admitting: Family Medicine

## 2020-08-07 ENCOUNTER — Other Ambulatory Visit: Payer: Self-pay

## 2020-08-07 DIAGNOSIS — Z1231 Encounter for screening mammogram for malignant neoplasm of breast: Secondary | ICD-10-CM | POA: Diagnosis present

## 2020-09-07 ENCOUNTER — Ambulatory Visit: Payer: BC Managed Care – PPO | Admitting: Family Medicine

## 2020-09-07 ENCOUNTER — Encounter: Payer: Self-pay | Admitting: Family Medicine

## 2020-09-07 ENCOUNTER — Other Ambulatory Visit: Payer: Self-pay

## 2020-09-07 VITALS — BP 124/68 | HR 100 | Temp 98.1°F | Resp 14 | Ht 61.0 in | Wt 194.0 lb

## 2020-09-07 DIAGNOSIS — Z124 Encounter for screening for malignant neoplasm of cervix: Secondary | ICD-10-CM

## 2020-09-07 NOTE — Progress Notes (Signed)
Subjective:    Patient ID: Faith Montgomery, female    DOB: 09-25-1955, 65 y.o.   MRN: [id number]  HPI  Patient is here today just for her Pap smear.  She is already had a physical exam this year there was significant only for a mildly elevated blood sugar of 107.  I reviewed her CMP, CBC, and lipid panel with her today prior to the Pap smear and we discussed a low carbohydrate diet.  Otherwise she is doing well with no concerns and her immunizations are up-to-date Past Medical History:  Diagnosis Date  . Hypertension   . Right carpal tunnel syndrome 04/22/2018   Past Surgical History:  Procedure Laterality Date  . CARPAL TUNNEL RELEASE Right 07/09/2018   Procedure: RIGHT CARPAL TUNNEL RELEASE;  Surgeon: [name], MD;  Location: [facility];  Service: Orthopedics;  Laterality: Right;  . HAMMER TOE SURGERY Bilateral   . KNEE ARTHROSCOPY     both knees at different times    Current Outpatient Medications on File Prior to Visit  Medication Sig Dispense Refill  . calcium-vitamin D (OSCAL WITH D) 500-200 MG-UNIT tablet Take 1 tablet by mouth.    . hydrochlorothiazide (HYDRODIURIL) 25 MG tablet TAKE 1 TABLET BY MOUTH EVERY DAY 90 tablet 3  . losartan (COZAAR) 50 MG tablet TAKE 1 TABLET BY MOUTH EVERY DAY 90 tablet 3   No current facility-administered medications on file prior to visit.     Allergies  Allergen Reactions  . Benazepril     Cough---09/2013   Social History   Socioeconomic History  . Marital status: Married    Spouse name: Not on file  . Number of children: Not on file  . Years of education: Not on file  . Highest education level: Not on file  Occupational History  . 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 Occupation: Teacher, English as a foreign language  Tobacco Use  . Smoking status: Never Smoker  . Smokeless tobacco: Never Used  Substance and Sexual Activity  . Alcohol use: No  . Drug use: No  . Sexual activity: Yes    Birth  control/protection: Post-menopausal  Other Topics Concern  . Not on file  Social History Narrative   Married. Lives with husband.    Entered 2015--Daughter and Granddaughter live with them now.    3 children. 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      Walks with husband every night for 30 minutes.( This was in 2014)   In 2015--pt says they walk sometimes.    She is active with granddaughter even on days they do not walk.   Social Determinants of Health   Financial Resource Strain: Not on file  Food Insecurity: Not on file  Transportation Needs: Not on file  Physical Activity: Not on file  Stress: Not on file  Social Connections: Not on file  Intimate Partner Violence: Not on file   Family History  Problem Relation Age of Onset  . Heart disease Father   . Diabetes Father   . Stroke Brother 50       stroke  . Colon  cancer Neg Hx      Review of Systems  All other systems reviewed and are negative.      Objective:   Physical Exam Vitals reviewed. Exam conducted with a chaperone present.  Constitutional:      General: She is not in acute distress.    Appearance: Normal appearance. She is normal weight. She is not ill-appearing, toxic-appearing or diaphoretic.  HENT:     Head: Normocephalic and atraumatic.     Right Ear: Tympanic membrane, ear canal and external ear normal. There is no impacted cerumen.     Left Ear: Tympanic membrane, ear canal and external ear normal. There is no impacted cerumen.     Nose: Nose normal. No congestion or rhinorrhea.     Mouth/Throat:     Mouth: Mucous membranes are moist.     Pharynx: Oropharynx is clear. No oropharyngeal exudate or posterior oropharyngeal erythema.  Eyes:     General: No scleral icterus.       Right eye: No discharge.        Left eye: No discharge.     Extraocular Movements: Extraocular movements  intact.     Conjunctiva/sclera: Conjunctivae normal.     Pupils: Pupils are equal, round, and reactive to light.  Neck:     Vascular: No carotid bruit.  Cardiovascular:     Rate and Rhythm: Normal rate and regular rhythm.     Pulses: Normal pulses.     Heart sounds: Normal heart sounds. No murmur heard. No friction rub. No gallop.   Pulmonary:     Effort: Pulmonary effort is normal. No respiratory distress.     Breath sounds: Normal breath sounds. No stridor. No wheezing, rhonchi or rales.  Chest:     Chest wall: No tenderness.  Abdominal:     General: Abdomen is flat. Bowel sounds are normal. There is no distension.     Palpations: Abdomen is soft. There is no mass.     Tenderness: There is no abdominal tenderness. There is no right CVA tenderness, left CVA tenderness, guarding or rebound.     Hernia: No hernia is present.  Genitourinary:    General: Normal vulva.     Vagina: Normal.     Cervix: Normal.     Uterus: Normal.      Adnexa: Right adnexa normal and left adnexa normal.  Musculoskeletal:     Cervical back: Normal range of motion and neck supple. No rigidity or tenderness.     Right lower leg: No edema.     Left lower leg: No edema.  Lymphadenopathy:     Cervical: No cervical adenopathy.  Skin:    General: Skin is warm.     Coloration: Skin is not jaundiced or pale.     Findings: No bruising, erythema, lesion or rash.  Neurological:     General: No focal deficit present.     Mental Status: She is alert and oriented to person, place, and time. Mental status is at baseline.     Cranial Nerves: No cranial nerve deficit.     Sensory: No sensory deficit.     Motor: No weakness.     Coordination: Coordination normal.     Gait: Gait normal.     Deep Tendon Reflexes: Reflexes normal.  Psychiatric:        Mood and Affect: Mood normal.        Behavior: Behavior normal.        Thought Content: Thought content normal.  Judgment: Judgment normal.            Assessment & Plan:  Cervical cancer screening - Plan: PAP, Thin Prep w/HPV rflx HPV Type 16/18 Pap smear was sent to pathology in a labeled container.  Await the results.  The remainder of her preventative care is up-to-date

## 2020-09-11 LAB — PAP, TP IMAGING W/ HPV RNA, RFLX HPV TYPE 16,18/45: HPV DNA High Risk: NOT DETECTED

## 2020-09-12 ENCOUNTER — Encounter: Payer: Self-pay | Admitting: *Deleted

## 2021-07-18 ENCOUNTER — Other Ambulatory Visit: Payer: Self-pay

## 2021-07-18 MED ORDER — LOSARTAN POTASSIUM 50 MG PO TABS: 50.0000 mg | ORAL_TABLET | Freq: Every day | ORAL | 0 refills | Status: DC

## 2021-07-18 MED ORDER — HYDROCHLOROTHIAZIDE 25 MG PO TABS: 25.0000 mg | ORAL_TABLET | Freq: Every day | ORAL | 0 refills | Status: DC

## 2021-08-12 ENCOUNTER — Other Ambulatory Visit: Payer: Self-pay | Admitting: Family Medicine

## 2021-08-21 ENCOUNTER — Other Ambulatory Visit (HOSPITAL_COMMUNITY): Payer: Self-pay | Admitting: Family Medicine

## 2021-08-21 DIAGNOSIS — Z1231 Encounter for screening mammogram for malignant neoplasm of breast: Secondary | ICD-10-CM

## 2021-08-27 ENCOUNTER — Ambulatory Visit (HOSPITAL_COMMUNITY)
Admission: RE | Admit: 2021-08-27 | Discharge: 2021-08-27 | Disposition: A | Discharge status: Home / Self Care | Payer: Medicare PPO | Source: Ambulatory Visit | Attending: Family Medicine | Admitting: Family Medicine

## 2021-08-27 ENCOUNTER — Other Ambulatory Visit: Payer: Self-pay

## 2021-08-27 DIAGNOSIS — Z1231 Encounter for screening mammogram for malignant neoplasm of breast: Secondary | ICD-10-CM | POA: Insufficient documentation

## 2021-09-09 ENCOUNTER — Other Ambulatory Visit: Payer: Self-pay | Admitting: Family Medicine

## 2021-10-15 ENCOUNTER — Other Ambulatory Visit: Payer: Self-pay | Admitting: Family Medicine

## 2021-10-16 NOTE — Telephone Encounter (Signed)
Requested medications are due for refill today.  yes ? ?Requested medications are on the active medications list.  yes ? ?Last refill. 09/11/2021 #30 0 refills for both ? ?Future visit scheduled.   no ? ?Notes to clinic.  Medication refill failed protocol d/t expired labs. Courtesy refills already given. ? ? ? ?Requested Prescriptions  ?Pending Prescriptions Disp Refills  ? losartan (COZAAR) 50 MG tablet [Pharmacy Med Name: LOSARTAN POTASSIUM 50 MG TAB] 30 tablet 0  ?  Sig: TAKE 1 TABLET BY MOUTH EVERY DAY  ?  ? Cardiovascular:  Angiotensin Receptor Blockers Failed - 10/15/2021 10:33 AM  ?  ?  Failed - Cr in normal range and within 180 days  ?  Creat  ?Date Value Ref Range Status  ?05/23/2020 0.63 0.50 - 0.99 mg/dL Final  ?  Comment:  ?  For patients >61 years of age, the reference limit ?for Creatinine is approximately 13% higher for people ?identified as African-American. ?. ?  ?  ?  ?  ?  Failed - K in normal range and within 180 days  ?  Potassium  ?Date Value Ref Range Status  ?05/23/2020 3.6 3.5 - 5.3 mmol/L Final  ?  ?  ?  ?  Failed - Valid encounter within last 6 months  ?  Recent Outpatient Visits   ? ?      ? 1 year ago Cervical cancer screening  ? [facility] Pick[place], Cammie Mcgee, MD  ? 1 year ago General medical exam  ? [facility] Susy Frizzle, MD  ? 1 year ago Benign essential HTN  ? [facility] Pickard, Cammie Mcgee, MD  ? 2 years ago Right leg pain  ? [place], Matthias Hughs, FNP  ? 2 years ago General medical exam  ? [facility] Pickard, Cammie Mcgee, MD  ? ?  ?  ? ? ?  ?  ?  Passed - Patient is not pregnant  ?  ?  Passed - Last BP in normal range  ?  BP Readings from Last 1 Encounters:  ?09/07/20 124/68  ?  ?  ?  ?  ? hydrochlorothiazide (HYDRODIURIL) 25 MG tablet [Pharmacy Med Name: HYDROCHLOROTHIAZIDE 25 MG TAB] 30 tablet 0  ?  Sig: TAKE 1 TABLET (25 MG TOTAL) BY MOUTH DAILY.  ?  ? Cardiovascular: Diuretics - Thiazide  Failed - 10/15/2021 10:33 AM  ?  ?  Failed - Cr in normal range and within 180 days  ?  Creat  ?Date Value Ref Range Status  ?05/23/2020 0.63 0.50 - 0.99 mg/dL Final  ?  Comment:  ?  For patients >29 years of age, the reference limit ?for Creatinine is approximately 13% higher for people ?identified as African-American. ?. ?  ?  ?  ?  ?  Failed - K in normal range and within 180 days  ?  Potassium  ?Date Value Ref Range Status  ?05/23/2020 3.6 3.5 - 5.3 mmol/L Final  ?  ?  ?  ?  Failed - Na in normal range and within 180 days  ?  Sodium  ?Date Value Ref Range Status  ?05/23/2020 139 135 - 146 mmol/L Final  ?  ?  ?  ?  Failed - Valid encounter within last 6 months  ?  Recent Outpatient Visits   ? ?      ? 1 year ago Cervical cancer screening  ? [facility] Susy Frizzle, MD  ? 1 year ago General medical exam  ? [facility] Susy Frizzle, MD  ? 1 year ago Benign essential HTN  ? [facility] Pickard, Cammie Mcgee, MD  ? 2 years ago Right leg pain  ? [place], Matthias Hughs, FNP  ? 2 years ago General medical exam  ? [facility] Susy Frizzle, MD  ? ?  ?  ? ? ?  ?  ?  Passed - Last BP in normal range  ?  BP Readings from Last 1 Encounters:  ?09/07/20 124/68  ?  ?  ?  ?  ?  ?

## 2021-11-16 ENCOUNTER — Other Ambulatory Visit: Payer: Self-pay | Admitting: Family Medicine

## 2021-11-29 ENCOUNTER — Other Ambulatory Visit: Payer: Self-pay | Admitting: Family Medicine

## 2021-11-29 NOTE — Telephone Encounter (Signed)
Pt. Has made appointment. Requested Prescriptions  Pending Prescriptions Disp Refills  . hydrochlorothiazide (HYDRODIURIL) 25 MG tablet [Pharmacy Med Name: HYDROCHLOROTHIAZIDE 25 MG TAB] 30 tablet 0    Sig: TAKE 1 TABLET (25 MG TOTAL) BY MOUTH DAILY. PT NEEDS OV FOR FURTHER REFILLS, LAST COURTESY FILL     Cardiovascular: Diuretics - Thiazide Failed - 11/29/2021 11:28 AM      Failed - Cr in normal range and within 180 days    Creat  Date Value Ref Range Status  05/23/2020 0.63 0.50 - 0.99 mg/dL Final    Comment:    For patients >44 years of age, the reference limit for Creatinine is approximately 13% higher for people identified as African-American. .          Failed - K in normal range and within 180 days    Potassium  Date Value Ref Range Status  05/23/2020 3.6 3.5 - 5.3 mmol/L Final         Failed - Na in normal range and within 180 days    Sodium  Date Value Ref Range Status  05/23/2020 139 135 - 146 mmol/L Final         Failed - Valid encounter within last 6 months    Recent Outpatient Visits          1 year ago Cervical cancer screening   [place] Pickard, Cammie Mcgee, MD   1 year ago General medical exam   [place] Susy Frizzle, MD   1 year ago Benign essential HTN   [place] Schaumann, Cammie Mcgee, MD   2 years ago Right leg pain   [place], [place], [place]   2 years ago General medical exam   [place] Pickard, Cammie Mcgee, MD      Future Appointments            In 2 weeks Dennard Schaumann, Cammie Mcgee, MD [place], [facility]           Passed - Last BP in normal range    BP Readings from Last 1 Encounters:  09/07/20 124/68         . losartan (COZAAR) 50 MG tablet [Pharmacy Med Name: LOSARTAN POTASSIUM 50 MG TAB] 30 tablet 0    Sig: TAKE 1 TABLET (50 MG TOTAL) BY MOUTH DAILY. PT NEEDS OV FOR FURTHER REFILLS, LAST COURTESY FILL     Cardiovascular:   Angiotensin Receptor Blockers Failed - 11/29/2021 11:28 AM      Failed - Cr in normal range and within 180 days    Creat  Date Value Ref Range Status  05/23/2020 0.63 0.50 - 0.99 mg/dL Final    Comment:    For patients >44 years of age, the reference limit for Creatinine is approximately 13% higher for people identified as African-American. .          Failed - K in normal range and within 180 days    Potassium  Date Value Ref Range Status  05/23/2020 3.6 3.5 - 5.3 mmol/L Final         Failed - Valid encounter within last 6 months    Recent Outpatient Visits          1 year ago Cervical cancer screening   [place] Pickard, Cammie Mcgee, MD   1 year ago General medical exam   [place] Pickard, Cammie Mcgee, MD  1 year ago Benign essential HTN   [place] Pickard, Cammie Mcgee, MD   2 years ago Right leg pain   [place], [place], FNP   2 years ago General medical exam   [place] Pickard, Cammie Mcgee, MD      Future Appointments            In 2 weeks Dennard Schaumann, Cammie Mcgee, MD [place], [place] - Patient is not pregnant      Passed - Last BP in normal range    BP Readings from Last 1 Encounters:  09/07/20 124/68

## 2021-12-17 ENCOUNTER — Ambulatory Visit: Payer: Medicare PPO | Admitting: Family Medicine

## 2021-12-17 VITALS — BP 130/82 | HR 64 | Temp 97.9°F | Ht 63.0 in | Wt 193.0 lb

## 2021-12-17 DIAGNOSIS — I10 Essential (primary) hypertension: Secondary | ICD-10-CM

## 2021-12-17 DIAGNOSIS — Z23 Encounter for immunization: Secondary | ICD-10-CM

## 2021-12-17 NOTE — Addendum Note (Signed)
Addended by: Colman Cater on: 12/17/2021 10:48 AM   Modules accepted: Orders

## 2021-12-17 NOTE — Progress Notes (Signed)
Subjective:    Patient ID: Faith Montgomery, female    DOB: [birth year], 66 y.o.   MRN: [id number]  HPI Patient is a very sweet 66 year old Caucasian female here today for follow-up of hypertension.  She is due for fasting lab work.  Her mammogram was performed in March and is up-to-date.  Her Pap smear was performed last year and was completely normal.  She had a colonoscopy in 2016 that revealed benign tissue.  She is due again in 2026.  She is due for the pneumonia vaccine.  Otherwise she is doing well.  She denies any chest pain shortness of breath or dyspnea on exertion. Past Medical History:  Diagnosis Date   Hypertension    Right carpal tunnel syndrome 04/22/2018   Past Surgical History:  Procedure Laterality Date   CARPAL TUNNEL RELEASE Right 07/09/2018   Procedure: RIGHT CARPAL TUNNEL RELEASE;  Surgeon: [name], MD;  Location: [place];  Service: Orthopedics;  Laterality: Right;   HAMMER TOE SURGERY Bilateral    KNEE ARTHROSCOPY     both knees at different times    Current Outpatient Medications on File Prior to Visit  Medication Sig Dispense Refill   calcium-vitamin D (OSCAL WITH D) 500-200 MG-UNIT tablet Take 1 tablet by mouth.     hydrochlorothiazide (HYDRODIURIL) 25 MG tablet TAKE 1 TABLET (25 MG TOTAL) BY MOUTH DAILY. PT NEEDS OV FOR FURTHER REFILLS, LAST COURTESY FILL 30 tablet 0   losartan (COZAAR) 50 MG tablet TAKE 1 TABLET (50 MG TOTAL) BY MOUTH DAILY. PT NEEDS OV FOR FURTHER REFILLS, LAST COURTESY FILL 30 tablet 0   No current facility-administered medications on file prior to visit.     Allergies  Allergen Reactions   Benazepril     Cough---09/2013   Social History   Socioeconomic History   Marital status: Married    Spouse name: Not on file   Number of children: Not on file   Years of education: Not on file   Highest education level: Not on file  Occupational History   Occupation: Art therapist: [employer]    Occupation: [employer]  Tobacco Use   Smoking status: Never   Smokeless tobacco: Never  Substance and Sexual Activity   Alcohol use: No   Drug use: No   Sexual activity: Yes    Birth control/protection: Post-menopausal  Other Topics Concern   Not on file  Social History Narrative   Married. Lives with husband.    Entered 2015--Daughter and Granddaughter live with them now.    3 children. 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      Walks with husband every night for 30 minutes.( This was in 2014)   In 2015--pt says they walk sometimes.    She is active with granddaughter even on days they do not walk.   Social Determinants of Health   Financial Resource Strain: Not on file  Food Insecurity: Not on file  Transportation Needs: Not on file  Physical Activity: Not on file  Stress: Not on file  Social Connections: Not on file  Intimate Partner Violence:  Not on file   Family History  Problem Relation Age of Onset   Heart disease Father    Diabetes Father    Stroke Brother 44       stroke   Colon cancer Neg Hx      Review of Systems  All other systems reviewed and are negative.      Objective:   Physical Exam Vitals reviewed.  Constitutional:      General: She is not in acute distress.    Appearance: Normal appearance. She is normal weight. She is not ill-appearing, toxic-appearing or diaphoretic.  HENT:     Head: Normocephalic and atraumatic.     Right Ear: Tympanic membrane, ear canal and external ear normal. There is no impacted cerumen.     Left Ear: Tympanic membrane, ear canal and external ear normal. There is no impacted cerumen.     Nose: Nose normal. No congestion or rhinorrhea.     Mouth/Throat:     Mouth: Mucous membranes are moist.     Pharynx: Oropharynx is clear. No oropharyngeal exudate or posterior oropharyngeal erythema.  Eyes:      General: No scleral icterus.       Right eye: No discharge.        Left eye: No discharge.     Extraocular Movements: Extraocular movements intact.     Conjunctiva/sclera: Conjunctivae normal.     Pupils: Pupils are equal, round, and reactive to light.  Neck:     Vascular: No carotid bruit.  Cardiovascular:     Rate and Rhythm: Normal rate and regular rhythm.     Pulses: Normal pulses.     Heart sounds: Normal heart sounds. No murmur heard.    No friction rub. No gallop.  Pulmonary:     Effort: Pulmonary effort is normal. No respiratory distress.     Breath sounds: Normal breath sounds. No stridor. No wheezing, rhonchi or rales.  Chest:     Chest wall: No tenderness.  Abdominal:     General: Abdomen is flat. Bowel sounds are normal. There is no distension.     Palpations: Abdomen is soft. There is no mass.     Tenderness: There is no abdominal tenderness. There is no right CVA tenderness, left CVA tenderness, guarding or rebound.     Hernia: No hernia is present.  Musculoskeletal:     Cervical back: Normal range of motion and neck supple. No rigidity or tenderness.     Right lower leg: No edema.     Left lower leg: No edema.  Lymphadenopathy:     Cervical: No cervical adenopathy.  Skin:    General: Skin is warm.     Coloration: Skin is not jaundiced or pale.     Findings: No bruising, erythema, lesion or rash.  Neurological:     General: No focal deficit present.     Mental Status: She is alert and oriented to person, place, and time. Mental status is at baseline.     Cranial Nerves: No cranial nerve deficit.     Sensory: No sensory deficit.     Motor: No weakness.     Coordination: Coordination normal.     Gait: Gait normal.     Deep Tendon Reflexes: Reflexes normal.  Psychiatric:        Mood and Affect: Mood normal.        Behavior: Behavior normal.        Thought Content: Thought content normal.  Judgment: Judgment normal.           Assessment &  Plan:  Benign essential HTN - Plan: CBC with Differential/Platelet, Lipid panel, COMPLETE METABOLIC PANEL WITH GFR Patient received Prevnar 20 today.  Her physical exam is normal.  Cancer screening is up-to-date.  I will check a CBC, CMP, lipid panel.  Goal LDL cholesterol is less than 100.  Blood pressure is outstanding.

## 2021-12-18 LAB — LIPID PANEL
Cholesterol: 180 mg/dL (ref <200)
HDL: 56 mg/dL (ref >=50)
LDL Cholesterol (Calc): 108 mg/dL (calc) — ABNORMAL HIGH
Non-HDL Cholesterol (Calc): 124 mg/dL (calc) (ref <130)
Total CHOL/HDL Ratio: 3.2 (calc) (ref <5.0)
Triglycerides: 74 mg/dL (ref <150)

## 2021-12-18 LAB — COMPLETE METABOLIC PANEL WITH GFR
AG Ratio: 1.4 (calc) (ref 1.0–2.5)
ALT: 12 U/L (ref 6–29)
AST: 14 U/L (ref 10–35)
Albumin: 4.2 g/dL (ref 3.6–5.1)
Alkaline phosphatase (APISO): 76 U/L (ref 37–153)
BUN: 15 mg/dL (ref 7–25)
CO2: 30 mmol/L (ref 20–32)
Calcium: 10.1 mg/dL (ref 8.6–10.4)
Chloride: 101 mmol/L (ref 98–110)
Creat: 0.72 mg/dL (ref 0.50–1.05)
Globulin: 2.9 g/dL (calc) (ref 1.9–3.7)
Glucose, Bld: 90 mg/dL (ref 65–99)
Potassium: 3.9 mmol/L (ref 3.5–5.3)
Sodium: 142 mmol/L (ref 135–146)
Total Bilirubin: 0.6 mg/dL (ref 0.2–1.2)
Total Protein: 7.1 g/dL (ref 6.1–8.1)
eGFR: 93 mL/min/{1.73_m2} (ref >=60)

## 2021-12-18 LAB — CBC WITH DIFFERENTIAL/PLATELET
Absolute Monocytes: 409 cells/uL (ref 200–950)
Basophils Absolute: 40 cells/uL (ref 0–200)
Basophils Relative: 0.6 %
Eosinophils Absolute: 201 cells/uL (ref 15–500)
Eosinophils Relative: 3 %
HCT: 41.5 % (ref 35.0–45.0)
Hemoglobin: 14.1 g/dL (ref 11.7–15.5)
Lymphs Abs: 1012 cells/uL (ref 850–3900)
MCH: 29.4 pg (ref 27.0–33.0)
MCHC: 34 g/dL (ref 32.0–36.0)
MCV: 86.5 fL (ref 80.0–100.0)
MPV: 10.6 fL (ref 7.5–12.5)
Monocytes Relative: 6.1 %
Neutro Abs: 5038 cells/uL (ref 1500–7800)
Neutrophils Relative %: 75.2 %
Platelets: 201 10*3/uL (ref 140–400)
RBC: 4.8 10*6/uL (ref 3.80–5.10)
RDW: 13.8 % (ref 11.0–15.0)
Total Lymphocyte: 15.1 %
WBC: 6.7 10*3/uL (ref 3.8–10.8)

## 2021-12-27 ENCOUNTER — Other Ambulatory Visit: Payer: Self-pay | Admitting: Family Medicine

## 2021-12-29 ENCOUNTER — Other Ambulatory Visit: Payer: Self-pay | Admitting: Family Medicine

## 2022-01-25 ENCOUNTER — Other Ambulatory Visit: Payer: Self-pay | Admitting: Family Medicine

## 2022-02-27 ENCOUNTER — Ambulatory Visit (INDEPENDENT_AMBULATORY_CARE_PROVIDER_SITE_OTHER): Payer: Medicare PPO

## 2022-02-27 VITALS — Ht 63.0 in | Wt 193.0 lb

## 2022-02-27 DIAGNOSIS — Z0000 Encounter for general adult medical examination without abnormal findings: Secondary | ICD-10-CM | POA: Diagnosis not present

## 2022-02-27 NOTE — Progress Notes (Signed)
Virtual Visit via Telephone Note  I connected with  Faith Montgomery on 02/27/22 at  8:30 AM EDT by telephone and verified that I am speaking with the correct person using two identifiers.  Location: Patient: HOME Provider: [facility] Persons participating in the virtual visit: patient/Nurse Health Advisor   I discussed the limitations, risks, security and privacy concerns of performing an evaluation and management service by telephone and the availability of in person appointments. The patient expressed understanding and agreed to proceed.  Interactive audio and video telecommunications were attempted between this nurse and patient, however failed, due to patient having technical difficulties OR patient did not have access to video capability.  We continued and completed visit with audio only.  Some vital signs may be absent or patient reported.   Chriss Driver, LPN

## 2022-02-27 NOTE — Patient Instructions (Signed)
Faith Montgomery , Thank you for taking time to come for your Medicare Wellness Visit. I appreciate your ongoing commitment to your health goals. Please review the following plan we discussed and let me know if I can assist you in the future.   Screening recommendations/referrals: Colonoscopy: Done 08/22/2014. Repeat in 10 years  Mammogram: Done 08/27/2021 Repeat annually  Bone Density: Discussed. Call when ready to schedule.  Recommended yearly ophthalmology/optometry visit for glaucoma screening and checkup Recommended yearly dental visit for hygiene and checkup  Vaccinations: Influenza vaccine: Done 05/01/2020 Repeat annually  Pneumococcal vaccine: Done 12/17/2021.  Tdap vaccine: Done 03/25/2012 Repeat in 10 years  Shingles vaccine: Due. Available at your local pharmacy.  Covid-19:Done 04/30/20, 09/11/2019 and 08/08/2019  Advanced directives: Please bring a copy of your health care power of attorney and living will to the office to be added to your chart at your convenience.   Conditions/risks identified: Aim for 30 minutes of exercise or brisk walking, 6-8 glasses of water, and 5 servings of fruits and vegetables each day.   Next appointment: Follow up in one year for your annual wellness visit 02/2023.    Preventive Care 42 Years and Older, Female Preventive care refers to lifestyle choices and visits with your health care provider that can promote health and wellness. What does preventive care include? A yearly physical exam. This is also called an annual well check. Dental exams once or twice a year. Routine eye exams. Ask your health care provider how often you should have your eyes checked. Personal lifestyle choices, including: Daily care of your teeth and gums. Regular physical activity. Eating a healthy diet. Avoiding tobacco and drug use. Limiting alcohol use. Practicing safe sex. Taking low-dose aspirin every day. Taking vitamin and mineral supplements as recommended by  your health care provider. What happens during an annual well check? The services and screenings done by your health care provider during your annual well check will depend on your age, overall health, lifestyle risk factors, and family history of disease. Counseling  Your health care provider may ask you questions about your: Alcohol use. Tobacco use. Drug use. Emotional well-being. Home and relationship well-being. Sexual activity. Eating habits. History of falls. Memory and ability to understand (cognition). Work and work Statistician. Reproductive health. Screening  You may have the following tests or measurements: Height, weight, and BMI. Blood pressure. Lipid and cholesterol levels. These may be checked every 5 years, or more frequently if you are over 70 years old. Skin check. Lung cancer screening. You may have this screening every year starting at age 22 if you have a 30-pack-year history of smoking and currently smoke or have quit within the past 15 years. Fecal occult blood test (FOBT) of the stool. You may have this test every year starting at age 8. Flexible sigmoidoscopy or colonoscopy. You may have a sigmoidoscopy every 5 years or a colonoscopy every 10 years starting at age 52. Hepatitis C blood test. Hepatitis B blood test. Sexually transmitted disease (STD) testing. Diabetes screening. This is done by checking your blood sugar (glucose) after you have not eaten for a while (fasting). You may have this done every 1-3 years. Bone density scan. This is done to screen for osteoporosis. You may have this done starting at age 1. Mammogram. This may be done every 1-2 years. Talk to your health care provider about how often you should have regular mammograms. Talk with your health care provider about your test results, treatment options, and if necessary,  the need for more tests. Vaccines  Your health care provider may recommend certain vaccines, such as: Influenza  vaccine. This is recommended every year. Tetanus, diphtheria, and acellular pertussis (Tdap, Td) vaccine. You may need a Td booster every 10 years. Zoster vaccine. You may need this after age 74. Pneumococcal 13-valent conjugate (PCV13) vaccine. One dose is recommended after age 69. Pneumococcal polysaccharide (PPSV23) vaccine. One dose is recommended after age 54. Talk to your health care provider about which screenings and vaccines you need and how often you need them. This information is not intended to replace advice given to you by your health care provider. Make sure you discuss any questions you have with your health care provider. Document Released: 06/23/2015 Document Revised: 02/14/2016 Document Reviewed: 03/28/2015 Elsevier Interactive Patient Education  2017 [place] Prevention in the Home Falls can cause injuries. They can happen to people of all ages. There are many things you can do to make your home safe and to help prevent falls. What can I do on the outside of my home? Regularly fix the edges of walkways and driveways and fix any cracks. Remove anything that might make you trip as you walk through a door, such as a raised step or threshold. Trim any bushes or trees on the path to your home. Use bright outdoor lighting. Clear any walking paths of anything that might make someone trip, such as rocks or tools. Regularly check to see if handrails are loose or broken. Make sure that both sides of any steps have handrails. Any raised decks and porches should have guardrails on the edges. Have any leaves, snow, or ice cleared regularly. Use sand or salt on walking paths during winter. Clean up any spills in your garage right away. This includes oil or grease spills. What can I do in the bathroom? Use night lights. Install grab bars by the toilet and in the tub and shower. Do not use towel bars as grab bars. Use non-skid mats or decals in the tub or shower. If you  need to sit down in the shower, use a plastic, non-slip stool. Keep the floor dry. Clean up any water that spills on the floor as soon as it happens. Remove soap buildup in the tub or shower regularly. Attach bath mats securely with double-sided non-slip rug tape. Do not have throw rugs and other things on the floor that can make you trip. What can I do in the bedroom? Use night lights. Make sure that you have a light by your bed that is easy to reach. Do not use any sheets or blankets that are too big for your bed. They should not hang down onto the floor. Have a firm chair that has side arms. You can use this for support while you get dressed. Do not have throw rugs and other things on the floor that can make you trip. What can I do in the kitchen? Clean up any spills right away. Avoid walking on wet floors. Keep items that you use a lot in easy-to-reach places. If you need to reach something above you, use a strong step stool that has a grab bar. Keep electrical cords out of the way. Do not use floor polish or wax that makes floors slippery. If you must use wax, use non-skid floor wax. Do not have throw rugs and other things on the floor that can make you trip. What can I do with my stairs? Do not leave any items on  the stairs. Make sure that there are handrails on both sides of the stairs and use them. Fix handrails that are broken or loose. Make sure that handrails are as long as the stairways. Check any carpeting to make sure that it is firmly attached to the stairs. Fix any carpet that is loose or worn. Avoid having throw rugs at the top or bottom of the stairs. If you do have throw rugs, attach them to the floor with carpet tape. Make sure that you have a light switch at the top of the stairs and the bottom of the stairs. If you do not have them, ask someone to add them for you. What else can I do to help prevent falls? Wear shoes that: Do not have high heels. Have rubber  bottoms. Are comfortable and fit you well. Are closed at the toe. Do not wear sandals. If you use a stepladder: Make sure that it is fully opened. Do not climb a closed stepladder. Make sure that both sides of the stepladder are locked into place. Ask someone to hold it for you, if possible. Clearly mark and make sure that you can see: Any grab bars or handrails. First and last steps. Where the edge of each step is. Use tools that help you move around (mobility aids) if they are needed. These include: Canes. Walkers. Scooters. Crutches. Turn on the lights when you go into a dark area. Replace any light bulbs as soon as they burn out. Set up your furniture so you have a clear path. Avoid moving your furniture around. If any of your floors are uneven, fix them. If there are any pets around you, be aware of where they are. Review your medicines with your doctor. Some medicines can make you feel dizzy. This can increase your chance of falling. Ask your doctor what other things that you can do to help prevent falls. This information is not intended to replace advice given to you by your health care provider. Make sure you discuss any questions you have with your health care provider. Document Released: 03/23/2009 Document Revised: 11/02/2015 Document Reviewed: 07/01/2014 Elsevier Interactive Patient Education  2017 [employer].

## 2022-03-13 DIAGNOSIS — M25531 Pain in right wrist: Secondary | ICD-10-CM | POA: Diagnosis not present

## 2022-03-31 ENCOUNTER — Other Ambulatory Visit: Payer: Self-pay | Admitting: Family Medicine

## 2022-05-02 ENCOUNTER — Other Ambulatory Visit: Payer: Self-pay | Admitting: Family Medicine

## 2022-05-03 NOTE — Telephone Encounter (Signed)
Requested medication (s) are due for refill today: yes   Requested medication (s) are on the active medication list: yes   Last refill:  01/25/22 #30 3 refills  Future visit scheduled: yes   Notes to clinic:  scheduled appt for medication refills/ physical in 1 week. Do you want to give enough medication until next visit or can give #30?     Requested Prescriptions  Pending Prescriptions Disp Refills   losartan (COZAAR) 50 MG tablet [Pharmacy Med Name: LOSARTAN POTASSIUM 50 MG TAB] 90 tablet 1    Sig: TAKE 1 TABLET (50 MG TOTAL) BY MOUTH DAILY. NEED OV FOR FURTHER REFILLS, LAST COURTESY FILL     Cardiovascular:  Angiotensin Receptor Blockers Failed - 05/02/2022  2:03 AM      Failed - Valid encounter within last 6 months    Recent Outpatient Visits           1 year ago Cervical cancer screening   [place] Pickard, Cammie Mcgee, MD   1 year ago General medical exam   [place] Susy Frizzle, MD   1 year ago Benign essential HTN   [place] Dennard Schaumann Cammie Mcgee, MD   2 years ago Right leg pain   [place], [place], FNP   2 years ago General medical exam   [place] Susy Frizzle, MD       Future Appointments             In 1 week Dennard Schaumann, Cammie Mcgee, MD [place]            Passed - Cr in normal range and within 180 days    Creat  Date Value Ref Range Status  12/17/2021 0.72 0.50 - 1.05 mg/dL Final         Passed - K in normal range and within 180 days    Potassium  Date Value Ref Range Status  12/17/2021 3.9 3.5 - 5.3 mmol/L Final         Passed - Patient is not pregnant      Passed - Last BP in normal range    BP Readings from Last 1 Encounters:  12/17/21 130/82

## 2022-05-14 ENCOUNTER — Ambulatory Visit (INDEPENDENT_AMBULATORY_CARE_PROVIDER_SITE_OTHER): Payer: Medicare PPO | Admitting: Family Medicine

## 2022-05-14 VITALS — BP 126/68 | HR 78 | Ht 63.0 in | Wt 188.4 lb

## 2022-05-14 DIAGNOSIS — I10 Essential (primary) hypertension: Secondary | ICD-10-CM | POA: Diagnosis not present

## 2022-05-14 DIAGNOSIS — L989 Disorder of the skin and subcutaneous tissue, unspecified: Secondary | ICD-10-CM

## 2022-05-14 DIAGNOSIS — Z0000 Encounter for general adult medical examination without abnormal findings: Secondary | ICD-10-CM | POA: Diagnosis not present

## 2022-05-14 DIAGNOSIS — D225 Melanocytic nevi of trunk: Secondary | ICD-10-CM

## 2022-05-14 DIAGNOSIS — D485 Neoplasm of uncertain behavior of skin: Secondary | ICD-10-CM | POA: Diagnosis not present

## 2022-05-14 NOTE — Progress Notes (Signed)
Subjective:    Patient ID: Faith Montgomery, female    DOB: 12-30-1955, 66 y.o.   MRN: [id number]  Patient is here today for complete physical exam.  Her last colonoscopy was in 2016.  She is due again in 2026.  She had a mammogram in March of this year that was normal.  I performed her Pap smear last year and this was normal so she does not require another Pap smear.  She is due for a flu shot which she would like to receive today.  She is also due for COVID booster as well as the shingles vaccine.  I recommended both of these to her.  She had lab work in July that was excellent.  During the course of her physical exam today there was a 4 mm dark black mold seen in the center of her back roughly around the level of T4.  I recommended performing a shave biopsy of this.  Otherwise she is doing well with no concerns Past Medical History:  Diagnosis Date   Hypertension    Right carpal tunnel syndrome 04/22/2018   Past Surgical History:  Procedure Laterality Date   CARPAL TUNNEL RELEASE Right 07/09/2018   Procedure: RIGHT CARPAL TUNNEL RELEASE;  Surgeon: [name], MD;  Location: [facility];  Service: Orthopedics;  Laterality: Right;   HAMMER TOE SURGERY Bilateral    KNEE ARTHROSCOPY     both knees at different times    Current Outpatient Medications on File Prior to Visit  Medication Sig Dispense Refill   calcium-vitamin D (OSCAL WITH D) 500-200 MG-UNIT tablet Take 1 tablet by mouth.     hydrochlorothiazide (HYDRODIURIL) 25 MG tablet TAKE 1 TABLET (25 MG TOTAL) BY MOUTH DAILY. PT NEEDS OV FOR FURTHER REFILLS, LAST COURTESY FILL 90 tablet 1   losartan (COZAAR) 50 MG tablet TAKE 1 TABLET (50 MG TOTAL) BY MOUTH DAILY. NEED OV FOR FURTHER REFILLS, LAST COURTESY FILL 90 tablet 1   No current facility-administered medications on file prior to visit.     Allergies  Allergen Reactions   Benazepril     Cough---09/2013   Social History   Socioeconomic History   Marital  status: Married    Spouse name: Not on file   Number of children: Not on file   Years of education: Not on file   Highest education level: Not on file  Occupational History   Occupation: Art therapist: [employer]   Occupation: Microbiologist [employer]  Tobacco Use   Smoking status: Never   Smokeless tobacco: Never  Substance and Sexual Activity   Alcohol use: No   Drug use: No   Sexual activity: Yes    Birth control/protection: Post-menopausal  Other Topics Concern   Not on file  Social History Narrative   Married. Lives with husband.    Entered 2015--Daughter and Granddaughter live with them now.    3 children. 1 Boy: Age 67             (as of 20)                      2 Girls: 55, 66 y/o      (as of 2014)      Works in [employer].   And cleans houses.      Walks with husband every night for 30 minutes.( This was in 2014)   In 2015--pt says they walk sometimes.  She is active with granddaughter even on days they do not walk.   Social Determinants of Health   Financial Resource Strain: Low Risk  (02/27/2022)   Overall Financial Resource Strain (CARDIA)    Difficulty of Paying Living Expenses: Not hard at all  Food Insecurity: No Food Insecurity (02/27/2022)   Hunger Vital Sign    Worried About Running Out of Food in the Last Year: Never true    Ran Out of Food in the Last Year: Never true  Transportation Needs: No Transportation Needs (02/27/2022)   PRAPARE - Hydrologist (Medical): No    Lack of Transportation (Non-Medical): No  Physical Activity: Sufficiently Active (02/27/2022)   Exercise Vital Sign    Days of Exercise per Week: 5 days    Minutes of Exercise per Session: 30 min  Stress: No Stress Concern Present (02/27/2022)   [place]    Feeling of Stress : Not at all  Social Connections: [place] (02/27/2022)   Social Connection and  Isolation Panel [NHANES]    Frequency of Communication with Friends and Family: More than three times a week    Frequency of Social Gatherings with Friends and Family: More than three times a week    Attends Religious Services: More than 4 times per year    Active Member of [employer] or Organizations: Yes    Attends Music therapist: More than 4 times per year    Marital Status: Married  Human resources officer Violence: Not At Risk (02/27/2022)   Humiliation, Afraid, Rape, and Kick questionnaire    Fear of Current or Ex-Partner: No    Emotionally Abused: No    Physically Abused: No    Sexually Abused: No   Family History  Problem Relation Age of Onset   Heart disease Father    Diabetes Father    Stroke Brother 47       stroke   Colon cancer Neg Hx      Review of Systems  All other systems reviewed and are negative.      Objective:   Physical Exam Vitals reviewed.  Constitutional:      General: She is not in acute distress.    Appearance: Normal appearance. She is normal weight. She is not ill-appearing, toxic-appearing or diaphoretic.  HENT:     Head: Normocephalic and atraumatic.     Right Ear: Tympanic membrane, ear canal and external ear normal. There is no impacted cerumen.     Left Ear: Tympanic membrane, ear canal and external ear normal. There is no impacted cerumen.     Nose: Nose normal. No congestion or rhinorrhea.     Mouth/Throat:     Mouth: Mucous membranes are moist.     Pharynx: Oropharynx is clear. No oropharyngeal exudate or posterior oropharyngeal erythema.  Eyes:     General: No scleral icterus.       Right eye: No discharge.        Left eye: No discharge.     Extraocular Movements: Extraocular movements intact.     Conjunctiva/sclera: Conjunctivae normal.     Pupils: Pupils are equal, round, and reactive to light.  Neck:     Vascular: No carotid bruit.  Cardiovascular:     Rate and Rhythm: Normal rate and regular rhythm.     Pulses: Normal  pulses.     Heart sounds: Normal heart sounds. No murmur heard.    No  friction rub. No gallop.  Pulmonary:     Effort: Pulmonary effort is normal. No respiratory distress.     Breath sounds: Normal breath sounds. No stridor. No wheezing, rhonchi or rales.  Chest:     Chest wall: No tenderness.  Abdominal:     General: Abdomen is flat. Bowel sounds are normal. There is no distension.     Palpations: Abdomen is soft. There is no mass.     Tenderness: There is no abdominal tenderness. There is no right CVA tenderness, left CVA tenderness, guarding or rebound.     Hernia: No hernia is present.  Musculoskeletal:     Cervical back: Normal range of motion and neck supple. No rigidity or tenderness.     Right lower leg: No edema.     Left lower leg: No edema.  Lymphadenopathy:     Cervical: No cervical adenopathy.  Skin:    General: Skin is warm.     Coloration: Skin is not jaundiced or pale.     Findings: No bruising, erythema, lesion or rash.  Neurological:     General: No focal deficit present.     Mental Status: She is alert and oriented to person, place, and time. Mental status is at baseline.     Cranial Nerves: No cranial nerve deficit.     Sensory: No sensory deficit.     Motor: No weakness.     Coordination: Coordination normal.     Gait: Gait normal.     Deep Tendon Reflexes: Reflexes normal.  Psychiatric:        Mood and Affect: Mood normal.        Behavior: Behavior normal.        Thought Content: Thought content normal.        Judgment: Judgment normal.           Assessment & Plan:  Encounter for Medicare annual wellness exam  Benign essential HTN  Skin lesion of back - Plan: Pathology Report ([facility]) Patient's blood pressure today is excellent.  She received her flu shot.  I encouraged her to get the shingles vaccine along with a COVID booster.  Her colonoscopy and mammogram are up-to-date.  She does not require another Pap smear.  I reviewed her fasting lab  work from July and this was acceptable so I will not repeat that today.  I am concerned about a lesion in the center of her back roughly around the level of T4.  I anesthetized the lesion with 0.1% lidocaine with epinephrine and performed a shave biopsy and sent this to pathology labeled container.  Hemostasis was achieved with Drysol and a Band-Aid.  Await the biopsy result.  I suspect an atypical mole.

## 2022-05-16 LAB — PATHOLOGY REPORT

## 2022-05-16 LAB — TISSUE SPECIMEN

## 2022-08-05 ENCOUNTER — Telehealth: Payer: Self-pay

## 2022-08-05 NOTE — Telephone Encounter (Signed)
Spoke w/pt on or around 07/19/22 re her flu vaccince? Per pt stated that she had her flu vaccine during her visit w/pcp back on 9/23. Per pt it even stated that she had also rec' her flu shot on her after visit summary per pcp.   However, per Immunization record/and [facility] she was only given prevnar20? I explained it to pt and told pt I will find out what happened and will get back to her. Pt voiced understanding.   2/23, spoke w/clinic administrator-Shannon J. Of the situation and will check into it.

## 2022-10-04 ENCOUNTER — Other Ambulatory Visit: Payer: Self-pay | Admitting: Family Medicine

## 2022-10-04 NOTE — Telephone Encounter (Signed)
Requested Prescriptions  Pending Prescriptions Disp Refills   hydrochlorothiazide (HYDRODIURIL) 25 MG tablet [Pharmacy Med Name: HYDROCHLOROTHIAZIDE 25 MG TAB] 90 tablet 0    Sig: TAKE 1 TABLET (25 MG TOTAL) BY MOUTH DAILY. PT NEEDS OV FOR FURTHER REFILLS, LAST COURTESY FILL     Cardiovascular: Diuretics - Thiazide Failed - 10/04/2022  1:42 AM      Failed - Cr in normal range and within 180 days    Creat  Date Value Ref Range Status  12/17/2021 0.72 0.50 - 1.05 mg/dL Final         Failed - K in normal range and within 180 days    Potassium  Date Value Ref Range Status  12/17/2021 3.9 3.5 - 5.3 mmol/L Final         Failed - Na in normal range and within 180 days    Sodium  Date Value Ref Range Status  12/17/2021 142 135 - 146 mmol/L Final         Failed - Valid encounter within last 6 months    Recent Outpatient Visits           2 years ago Cervical cancer screening   [facility] Donita Brooks, MD   2 years ago General medical exam   [facility] Donita Brooks, MD   2 years ago Benign essential HTN   [facility] Tanya Nones Priscille Heidelberg, MD   3 years ago Right leg pain   [facility] Elmore Guise, FNP   3 years ago General medical exam   [facility] Pickard, Priscille Heidelberg, MD              Passed - Last BP in normal range    BP Readings from Last 1 Encounters:  05/14/22 126/68

## 2022-11-07 ENCOUNTER — Other Ambulatory Visit (HOSPITAL_COMMUNITY): Payer: Self-pay | Admitting: Family Medicine

## 2022-11-07 DIAGNOSIS — Z1231 Encounter for screening mammogram for malignant neoplasm of breast: Secondary | ICD-10-CM

## 2022-11-11 ENCOUNTER — Other Ambulatory Visit: Payer: Self-pay | Admitting: Family Medicine

## 2022-11-13 ENCOUNTER — Encounter (HOSPITAL_COMMUNITY): Payer: Self-pay

## 2022-11-13 ENCOUNTER — Ambulatory Visit (HOSPITAL_COMMUNITY)
Admission: RE | Admit: 2022-11-13 | Discharge: 2022-11-13 | Disposition: A | Discharge status: Home / Self Care | Payer: Medicare PPO | Source: Ambulatory Visit | Attending: Family Medicine | Admitting: Family Medicine

## 2022-11-13 DIAGNOSIS — Z1231 Encounter for screening mammogram for malignant neoplasm of breast: Secondary | ICD-10-CM

## 2023-01-02 ENCOUNTER — Other Ambulatory Visit: Payer: Self-pay | Admitting: Family Medicine

## 2023-01-17 ENCOUNTER — Other Ambulatory Visit: Payer: Self-pay | Admitting: Family Medicine

## 2023-01-17 ENCOUNTER — Encounter: Payer: Self-pay | Admitting: Family Medicine

## 2023-01-17 MED ORDER — NIRMATRELVIR/RITONAVIR (PAXLOVID)TABLET: 3.0000 | ORAL_TABLET | Freq: Two times a day (BID) | ORAL | 0 refills | Status: AC

## 2023-01-21 ENCOUNTER — Other Ambulatory Visit: Payer: Self-pay | Admitting: Family Medicine

## 2023-01-22 NOTE — Telephone Encounter (Signed)
Requested medication (s) are due for refill today - yes  Requested medication (s) are on the active medication list -yes  Future visit scheduled -no  Last refill: 10/04/22 #90  Notes to clinic: Last RF has notes from office- last OV 05/14/22- sent for review of request  Requested Prescriptions  Pending Prescriptions Disp Refills   hydrochlorothiazide (HYDRODIURIL) 25 MG tablet [Pharmacy Med Name: HYDROCHLOROTHIAZIDE 25 MG TAB] 90 tablet 0    Sig: TAKE 1 TABLET (25 MG TOTAL) BY MOUTH DAILY. PT NEEDS OV FOR FURTHER REFILLS, LAST COURTESY FILL     Cardiovascular: Diuretics - Thiazide Failed - 01/21/2023  9:06 AM      Failed - Cr in normal range and within 180 days    Creat  Date Value Ref Range Status  12/17/2021 0.72 0.50 - 1.05 mg/dL Final         Failed - K in normal range and within 180 days    Potassium  Date Value Ref Range Status  12/17/2021 3.9 3.5 - 5.3 mmol/L Final         Failed - Na in normal range and within 180 days    Sodium  Date Value Ref Range Status  12/17/2021 142 135 - 146 mmol/L Final         Failed - Valid encounter within last 6 months    Recent Outpatient Visits           2 years ago Cervical cancer screening   [facility] Donita Brooks, MD   2 years ago General medical exam   [facility] Donita Brooks, MD   2 years ago Benign essential HTN   [facility] Tanya Nones, Priscille Heidelberg, MD   3 years ago Right leg pain   [facility] Elmore Guise, FNP   3 years ago General medical exam   [facility] Pickard, Priscille Heidelberg, MD              Passed - Last BP in normal range    BP Readings from Last 1 Encounters:  05/14/22 126/68            Requested Prescriptions  Pending Prescriptions Disp Refills   hydrochlorothiazide (HYDRODIURIL) 25 MG tablet [Pharmacy Med Name: HYDROCHLOROTHIAZIDE 25 MG TAB] 90 tablet 0    Sig: TAKE 1 TABLET (25 MG TOTAL) BY MOUTH DAILY.  PT NEEDS OV FOR FURTHER REFILLS, LAST COURTESY FILL     Cardiovascular: Diuretics - Thiazide Failed - 01/21/2023  9:06 AM      Failed - Cr in normal range and within 180 days    Creat  Date Value Ref Range Status  12/17/2021 0.72 0.50 - 1.05 mg/dL Final         Failed - K in normal range and within 180 days    Potassium  Date Value Ref Range Status  12/17/2021 3.9 3.5 - 5.3 mmol/L Final         Failed - Na in normal range and within 180 days    Sodium  Date Value Ref Range Status  12/17/2021 142 135 - 146 mmol/L Final         Failed - Valid encounter within last 6 months    Recent Outpatient Visits           2 years ago Cervical cancer screening   [facility] Donita Brooks, MD   2 years ago General medical exam  [facility] Pickard, Priscille Heidelberg, MD   2 years ago Benign essential HTN   [facility] Tanya Nones, Priscille Heidelberg, MD   3 years ago Right leg pain   [facility] Elmore Guise, FNP   3 years ago General medical exam   [facility] Donita Brooks, MD              Passed - Last BP in normal range    BP Readings from Last 1 Encounters:  05/14/22 126/68

## 2023-02-03 ENCOUNTER — Ambulatory Visit: Payer: Medicare PPO | Admitting: Family Medicine

## 2023-02-03 VITALS — BP 144/92 | HR 69 | Temp 97.9°F | Ht 63.0 in | Wt 186.0 lb

## 2023-02-03 DIAGNOSIS — I10 Essential (primary) hypertension: Secondary | ICD-10-CM | POA: Diagnosis not present

## 2023-02-03 MED ORDER — HYDROCHLOROTHIAZIDE 25 MG PO TABS: 25.0000 mg | ORAL_TABLET | Freq: Every day | ORAL | 3 refills | Status: DC

## 2023-02-03 MED ORDER — LOSARTAN POTASSIUM 50 MG PO TABS: 50.0000 mg | ORAL_TABLET | Freq: Every day | ORAL | 3 refills | Status: DC

## 2023-02-03 NOTE — Progress Notes (Signed)
Subjective:    Patient ID: Faith Montgomery, female    DOB: May 10, 1956, 67 y.o.   MRN: [id number]  HPI Patient is a very sweet 67 year old Caucasian female here today for follow-up of her hypertension.  However she is off her hydrochlorothiazide.  At the present time she is only taking losartan 50 mg a day.  Today her blood pressure is elevated.  She denies any chest pain or shortness of breath or dyspnea on exertion. Past Medical History:  Diagnosis Date   Hypertension    Right carpal tunnel syndrome 04/22/2018   Past Surgical History:  Procedure Laterality Date   CARPAL TUNNEL RELEASE Right 07/09/2018   Procedure: RIGHT CARPAL TUNNEL RELEASE;  Surgeon: Betha Loa, MD;  Location: [facility];  Service: Orthopedics;  Laterality: Right;   HAMMER TOE SURGERY Bilateral    KNEE ARTHROSCOPY     both knees at different times    Current Outpatient Medications on File Prior to Visit  Medication Sig Dispense Refill   calcium-vitamin D (OSCAL WITH D) 500-200 MG-UNIT tablet Take 1 tablet by mouth.     No current facility-administered medications on file prior to visit.     Allergies  Allergen Reactions   Benazepril     Cough---09/2013   Social History   Socioeconomic History   Marital status: Married    Spouse name: Not on file   Number of children: Not on file   Years of education: Not on file   Highest education level: 12th grade  Occupational History   Occupation: Youth worker: [employer]   Occupation: Regulatory affairs officer  Tobacco Use   Smoking status: Never   Smokeless tobacco: Never  Substance and Sexual Activity   Alcohol use: No   Drug use: No   Sexual activity: Yes    Birth control/protection: Post-menopausal  Other Topics Concern   Not on file  Social History Narrative   Married. Lives with husband.    Entered 2015--Daughter and Granddaughter live with them now.    3 children. 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      Walks with husband every night for 30 minutes.( This was in 2014)   In 2015--pt says they walk sometimes.    She is active with granddaughter even on days they do not walk.   Social Determinants of Health   Financial Resource Strain: Low Risk  (01/30/2023)   Overall Financial Resource Strain (CARDIA)    Difficulty of Paying Living Expenses: Not very hard  Food Insecurity: No Food Insecurity (01/30/2023)   Hunger Vital Sign    Worried About Running Out of Food in the Last Year: Never true    Ran Out of Food in the Last Year: Never true  Transportation Needs: No Transportation Needs (01/30/2023)   PRAPARE - Administrator, Civil Service (Medical): No    Lack of Transportation (Non-Medical): No  Physical Activity: Insufficiently Active (01/30/2023)   Exercise Vital Sign    Days of Exercise per Week: 3 days    Minutes of Exercise per Session: 40 min  Stress: No Stress  Concern Present (01/30/2023)   [employer] of Occupational Health - Occupational Stress Questionnaire    Feeling of Stress : Not at all  Social Connections: Socially Integrated (01/30/2023)   Social Connection and Isolation Panel [NHANES]    Frequency of Communication with Friends and Family: More than three times a week    Frequency of Social Gatherings with Friends and Family: Three times a week    Attends Religious Services: More than 4 times per year    Active Member of Clubs or Organizations: Yes    Attends Banker Meetings: More than 4 times per year    Marital Status: Married  Catering manager Violence: Not At Risk (02/27/2022)   Humiliation, Afraid, Rape, and Kick questionnaire    Fear of Current or Ex-Partner: No    Emotionally Abused: No    Physically Abused: No    Sexually Abused: No   Family History  Problem Relation Age of Onset   Heart disease Father    Diabetes Father     Stroke Brother 50       stroke   Colon cancer Neg Hx      Review of Systems  All other systems reviewed and are negative.      Objective:   Physical Exam Vitals reviewed.  Constitutional:      General: She is not in acute distress.    Appearance: Normal appearance. She is normal weight. She is not ill-appearing, toxic-appearing or diaphoretic.  HENT:     Head: Normocephalic and atraumatic.     Right Ear: Tympanic membrane, ear canal and external ear normal. There is no impacted cerumen.     Left Ear: Tympanic membrane, ear canal and external ear normal. There is no impacted cerumen.     Nose: Nose normal. No congestion or rhinorrhea.     Mouth/Throat:     Mouth: Mucous membranes are moist.     Pharynx: Oropharynx is clear. No oropharyngeal exudate or posterior oropharyngeal erythema.  Eyes:     General: No scleral icterus.       Right eye: No discharge.        Left eye: No discharge.     Extraocular Movements: Extraocular movements intact.     Conjunctiva/sclera: Conjunctivae normal.     Pupils: Pupils are equal, round, and reactive to light.  Neck:     Vascular: No carotid bruit.  Cardiovascular:     Rate and Rhythm: Normal rate and regular rhythm.     Pulses: Normal pulses.     Heart sounds: Normal heart sounds. No murmur heard.    No friction rub. No gallop.  Pulmonary:     Effort: Pulmonary effort is normal. No respiratory distress.     Breath sounds: Normal breath sounds. No stridor. No wheezing, rhonchi or rales.  Chest:     Chest wall: No tenderness.  Abdominal:     General: Abdomen is flat. Bowel sounds are normal. There is no distension.     Palpations: Abdomen is soft. There is no mass.     Tenderness: There is no abdominal tenderness. There is no right CVA tenderness, left CVA tenderness, guarding or rebound.     Hernia: No hernia is present.  Musculoskeletal:     Cervical back: Normal range of motion and neck supple. No rigidity or tenderness.      Right lower leg: No edema.     Left lower leg: No edema.  Lymphadenopathy:     Cervical: No cervical  adenopathy.  Skin:    General: Skin is warm.     Coloration: Skin is not jaundiced or pale.     Findings: No bruising, erythema, lesion or rash.  Neurological:     General: No focal deficit present.     Mental Status: She is alert and oriented to person, place, and time. Mental status is at baseline.     Cranial Nerves: No cranial nerve deficit.     Sensory: No sensory deficit.     Motor: No weakness.     Coordination: Coordination normal.     Gait: Gait normal.     Deep Tendon Reflexes: Reflexes normal.  Psychiatric:        Mood and Affect: Mood normal.        Behavior: Behavior normal.        Thought Content: Thought content normal.        Judgment: Judgment normal.           Assessment & Plan:  Benign essential HTN - Plan: CBC with Differential/Platelet, COMPLETE METABOLIC PANEL WITH GFR, Lipid panel I asked the patient to resume hydrochlorothiazide along with the losartan.  Recheck blood pressure daily for 2 weeks and report the values to me.  Goal blood pressure is less than 140/90.  If not I would increase losartan 100 mg a day.

## 2023-02-04 LAB — CBC WITH DIFFERENTIAL/PLATELET
Absolute Monocytes: 582 {cells}/uL (ref 200–950)
Basophils Absolute: 50 {cells}/uL (ref 0–200)
Basophils Relative: 0.9 %
Eosinophils Absolute: 241 {cells}/uL (ref 15–500)
Eosinophils Relative: 4.3 %
HCT: 39.6 % (ref 35.0–45.0)
Hemoglobin: 13.3 g/dL (ref 11.7–15.5)
Lymphs Abs: 1008 {cells}/uL (ref 850–3900)
MCH: 29.9 pg (ref 27.0–33.0)
MCHC: 33.6 g/dL (ref 32.0–36.0)
MCV: 89 fL (ref 80.0–100.0)
MPV: 10.8 fL (ref 7.5–12.5)
Monocytes Relative: 10.4 %
Neutro Abs: 3718 {cells}/uL (ref 1500–7800)
Neutrophils Relative %: 66.4 %
Platelets: 228 10*3/uL (ref 140–400)
RBC: 4.45 10*6/uL (ref 3.80–5.10)
RDW: 15.8 % — ABNORMAL HIGH (ref 11.0–15.0)
Total Lymphocyte: 18 %
WBC: 5.6 10*3/uL (ref 3.8–10.8)

## 2023-02-04 LAB — COMPLETE METABOLIC PANEL WITH GFR
AG Ratio: 1.4 (calc) (ref 1.0–2.5)
ALT: 9 U/L (ref 6–29)
AST: 12 U/L (ref 10–35)
Albumin: 4.1 g/dL (ref 3.6–5.1)
Alkaline phosphatase (APISO): 76 U/L (ref 37–153)
BUN: 15 mg/dL (ref 7–25)
CO2: 25 mmol/L (ref 20–32)
Calcium: 9.5 mg/dL (ref 8.6–10.4)
Chloride: 105 mmol/L (ref 98–110)
Creat: 0.76 mg/dL (ref 0.50–1.05)
Globulin: 3 g/dL (ref 1.9–3.7)
Glucose, Bld: 71 mg/dL (ref 65–99)
Potassium: 4.1 mmol/L (ref 3.5–5.3)
Sodium: 140 mmol/L (ref 135–146)
Total Bilirubin: 0.5 mg/dL (ref 0.2–1.2)
Total Protein: 7.1 g/dL (ref 6.1–8.1)
eGFR: 86 mL/min/{1.73_m2} (ref >=60)

## 2023-02-04 LAB — LIPID PANEL
Cholesterol: 193 mg/dL (ref <200)
HDL: 58 mg/dL (ref >=50)
LDL Cholesterol (Calc): 111 mg/dL — ABNORMAL HIGH
Non-HDL Cholesterol (Calc): 135 mg/dL — ABNORMAL HIGH (ref <130)
Total CHOL/HDL Ratio: 3.3 (calc) (ref <5.0)
Triglycerides: 127 mg/dL (ref <150)

## 2023-05-13 ENCOUNTER — Telehealth: Payer: Self-pay | Admitting: *Deleted

## 2023-05-13 NOTE — Patient Outreach (Signed)
  Care Coordination   05/13/2023  Name: TANIYLA DEARDEN MRN: [id number] DOB: Jan 28, 1956   Care Coordination Outreach Attempts:  An unsuccessful telephone outreach was attempted today to offer the patient information about available care coordination services. HIPAA compliant messages left on voicemail providing contact information for CSW, encouraging patient to return CSW's call at her earliest convenience.  Follow Up Plan:  Additional outreach attempts will be made to offer the patient care coordination information and services.   Encounter Outcome:  No Answer.   Care Coordination Interventions:  No, not indicated.    Danford Bad, BSW, MSW, Printmaker Social Work Case Set designer Health  [facility], Population Health Direct Dial: [phone]  Fax: [phone] Email: [email or web address]: [email or web address]

## 2023-05-23 ENCOUNTER — Telehealth: Payer: Self-pay | Admitting: *Deleted

## 2023-05-23 NOTE — Patient Outreach (Signed)
  Care Coordination   05/23/2023  Name: Faith Montgomery MRN: [id number] DOB: May 31, 1956   Care Coordination Outreach Attempts:  A second unsuccessful outreach was attempted today to offer the patient with information about available care coordination services. HIPAA compliant messages left on voicemail providing contact information for CSW, encouraging patient to return CSW's call at her earliest convenience.  Follow Up Plan:  Additional outreach attempts will be made to offer the patient care coordination information and services.   Encounter Outcome:  No Answer.   Care Coordination Interventions:  No, not indicated.    Danford Bad, BSW, MSW, Printmaker Social Work Case Set designer Health  [facility], Population Health Direct Dial: [phone]  Fax: [phone] Email: [email or web address]: [email or web address]

## 2023-05-28 ENCOUNTER — Telehealth: Payer: Self-pay | Admitting: *Deleted

## 2023-05-28 NOTE — Patient Outreach (Signed)
  Care Coordination   05/28/2023  Name: Faith Montgomery MRN: [id number] DOB: 1956/01/08   Care Coordination Outreach Attempts:  A third unsuccessful outreach was attempted today to offer the patient with information about available complex care management services. HIPAA compliant messages left on voicemail providing contact information for CSW, encouraging patient to return CSW's call at her earliest convenience.  Follow Up Plan:  No further outreach attempts will be made at this time. We have been unable to contact the patient to offer or enroll patient in complex care management services.  Encounter Outcome:  No Answer.   Care Coordination Interventions:  No, not indicated.    Danford Bad, BSW, MSW, Printmaker Social Work Case Set designer Health  [facility], Population Health Direct Dial: [phone]  Fax: [phone] Email: [email or web address]: [email or web address]

## 2023-06-05 ENCOUNTER — Telehealth: Payer: Self-pay

## 2023-06-05 NOTE — Telephone Encounter (Signed)
Copied from CRM [id number]. Topic: Clinical - Medication Question >> Jun 05, 2023  9:13 AM Mosetta Putt H wrote: Reason for CRM: Taking losartan (COZAAR) 50 MG tablet sometimes pt feels woozy

## 2023-06-10 ENCOUNTER — Encounter: Payer: Self-pay | Admitting: Family Medicine

## 2023-06-10 ENCOUNTER — Ambulatory Visit: Payer: Medicare PPO | Admitting: Family Medicine

## 2023-06-10 VITALS — BP 136/82 | HR 77 | Ht 63.0 in | Wt 189.0 lb

## 2023-06-10 DIAGNOSIS — H8111 Benign paroxysmal vertigo, right ear: Secondary | ICD-10-CM

## 2023-06-10 NOTE — Progress Notes (Signed)
 Subjective:    Patient ID: Faith Montgomery, female    DOB: 06-Apr-1956, 67 y.o.   MRN: [id number]  HPI Patient states for the last 3 weeks she has been having episodes of dizziness and disequilibrium when she sits up suddenly and.  It almost feels like the room is moving.  She denies any syncope.  She denies any presyncope.  She denies any orthostatic lightheadedness.  Instead it is more of a disequilibrium or dizziness.  She denies any full-blown vertigo.  Her blood pressure at home is typically in the 130s over 80s.  She denies any severe headaches, neurologic deficits, tinnitus.  She denies any sinus pain or ear pain. Past Medical History:  Diagnosis Date   Hypertension    Right carpal tunnel syndrome 04/22/2018   Past Surgical History:  Procedure Laterality Date   CARPAL TUNNEL RELEASE Right 07/09/2018   Procedure: RIGHT CARPAL TUNNEL RELEASE;  Surgeon: Murrell Drivers, MD;  Location: [facility];  Service: Orthopedics;  Laterality: Right;   HAMMER TOE SURGERY Bilateral    KNEE ARTHROSCOPY     both knees at different times    Current Outpatient Medications on File Prior to Visit  Medication Sig Dispense Refill   calcium-vitamin D  (OSCAL WITH D) 500-200 MG-UNIT tablet Take 1 tablet by mouth.     hydrochlorothiazide  (HYDRODIURIL ) 25 MG tablet Take 1 tablet (25 mg total) by mouth daily. PT NEEDS OV FOR FURTHER REFILLS, LAST COURTESY FILL 90 tablet 3   losartan  (COZAAR ) 50 MG tablet Take 1 tablet (50 mg total) by mouth daily. 90 tablet 3   No current facility-administered medications on file prior to visit.     Allergies  Allergen Reactions   Benazepril     Cough---09/2013   Social History   Socioeconomic History   Marital status: Married    Spouse name: Not on file   Number of children: Not on file   Years of education: Not on file   Highest education level: 12th grade  Occupational History   Occupation: Youth Worker: [employer]    Occupation: Regulatory Affairs Officer  Tobacco Use   Smoking status: Never   Smokeless tobacco: Never  Substance and Sexual Activity   Alcohol use: No   Drug use: No   Sexual activity: Yes    Birth control/protection: Post-menopausal  Other Topics Concern   Not on file  Social History Narrative   Married. Lives with husband.    Entered 2015--Daughter and Granddaughter live with them now.    3 children. 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      Walks with husband every night for 30 minutes.( This was in 2014)   In 2015--pt says they walk sometimes.    She is active with granddaughter even on days they do not walk.   Social Drivers of Corporate Investment Banker Strain: Low Risk  (06/05/2023)   Overall Financial Resource Strain (CARDIA)    Difficulty of Paying Living Expenses: Not hard at all  Food Insecurity: No Food Insecurity (06/05/2023)   Hunger Vital Sign    Worried About Running  Out of Food in the Last Year: Never true    Ran Out of Food in the Last Year: Never true  Transportation Needs: No Transportation Needs (06/05/2023)   PRAPARE - Administrator, Civil Service (Medical): No    Lack of Transportation (Non-Medical): No  Physical Activity: Insufficiently Active (06/05/2023)   Exercise Vital Sign    Days of Exercise per Week: 2 days    Minutes of Exercise per Session: 10 min  Stress: No Stress Concern Present (06/05/2023)   [employer] of Occupational Health - Occupational Stress Questionnaire    Feeling of Stress : Not at all  Social Connections: Socially Integrated (06/05/2023)   Social Connection and Isolation Panel [NHANES]    Frequency of Communication with Friends and Family: More than three times a week    Frequency of Social Gatherings with Friends and Family: Three times a week    Attends Religious Services: More than 4 times per year     Active Member of Clubs or Organizations: Yes    Attends Banker Meetings: More than 4 times per year    Marital Status: Married  Catering Manager Violence: Not At Risk (02/27/2022)   Humiliation, Afraid, Rape, and Kick questionnaire    Fear of Current or Ex-Partner: No    Emotionally Abused: No    Physically Abused: No    Sexually Abused: No   Family History  Problem Relation Age of Onset   Heart disease Father    Diabetes Father    Stroke Brother 50       stroke   Colon cancer Neg Hx      Review of Systems  All other systems reviewed and are negative.      Objective:   Physical Exam Vitals reviewed.  Constitutional:      General: She is not in acute distress.    Appearance: Normal appearance. She is normal weight. She is not ill-appearing, toxic-appearing or diaphoretic.  HENT:     Head: Normocephalic and atraumatic.     Right Ear: Tympanic membrane, ear canal and external ear normal. There is no impacted cerumen.     Left Ear: Tympanic membrane, ear canal and external ear normal. There is no impacted cerumen.     Nose: Nose normal. No congestion or rhinorrhea.     Mouth/Throat:     Mouth: Mucous membranes are moist.     Pharynx: Oropharynx is clear. No oropharyngeal exudate or posterior oropharyngeal erythema.  Eyes:     General: No scleral icterus.       Right eye: No discharge.        Left eye: No discharge.     Extraocular Movements: Extraocular movements intact.     Conjunctiva/sclera: Conjunctivae normal.     Pupils: Pupils are equal, round, and reactive to light.  Neck:     Vascular: No carotid bruit.  Cardiovascular:     Rate and Rhythm: Normal rate and regular rhythm.     Pulses: Normal pulses.     Heart sounds: Normal heart sounds. No murmur heard.    No friction rub. No gallop.  Pulmonary:     Effort: Pulmonary effort is normal. No respiratory distress.     Breath sounds: Normal breath sounds. No stridor. No wheezing, rhonchi or  rales.  Chest:     Chest wall: No tenderness.  Abdominal:     General: Abdomen is flat. Bowel sounds are normal. There is no distension.  Palpations: Abdomen is soft. There is no mass.     Tenderness: There is no abdominal tenderness. There is no right CVA tenderness, left CVA tenderness, guarding or rebound.     Hernia: No hernia is present.  Musculoskeletal:     Cervical back: Normal range of motion and neck supple. No rigidity or tenderness.     Right lower leg: No edema.     Left lower leg: No edema.  Lymphadenopathy:     Cervical: No cervical adenopathy.  Skin:    General: Skin is warm.     Coloration: Skin is not jaundiced or pale.     Findings: No bruising, erythema, lesion or rash.  Neurological:     General: No focal deficit present.     Mental Status: She is alert and oriented to person, place, and time. Mental status is at baseline.     Cranial Nerves: No cranial nerve deficit.     Sensory: No sensory deficit.     Motor: No weakness.     Coordination: Coordination normal.     Gait: Gait normal.     Deep Tendon Reflexes: Reflexes normal.  Psychiatric:        Mood and Affect: Mood normal.        Behavior: Behavior normal.        Thought Content: Thought content normal.        Judgment: Judgment normal.     Dix-Hallpike maneuver is positive for disequilibrium and dizziness when she turns her head to the right.      Assessment & Plan:  Benign paroxysmal positional vertigo of right ear Although not obvious, I believe the patient's symptoms are most consistent with BPPV.  I do not believe the patient is dealing with orthostatic hypotension.  I recommended tincture of time and meclizine as needed for the next 1 to 2 weeks.  I anticipate the problem gradually improve on its own.  If the patient develops severe headaches, consider neuroimaging.  If she develops lightheadedness or near syncope consider orthostatic dizziness and hypotension.

## 2023-09-22 ENCOUNTER — Ambulatory Visit: Admitting: Family Medicine

## 2023-09-22 VITALS — BP 136/82 | HR 69 | Temp 98.7°F | Ht 63.0 in | Wt 189.0 lb

## 2023-09-22 DIAGNOSIS — Z0000 Encounter for general adult medical examination without abnormal findings: Secondary | ICD-10-CM | POA: Diagnosis not present

## 2023-09-22 NOTE — Patient Instructions (Signed)
 Advance Directive  Advance directives are legal papers that state your wishes about health care decisions. They let your wishes be known to family, friends, and health care providers if you become unable to speak for yourself.  You should write these papers out over time rather than all at once. They can be changed and updated at any time. The types of advance directives include: Medical power of attorney (POA). Living wills. Do not resuscitate (DNR) or do not attempt resuscitation (DNAR) orders. What are a health care proxy and medical POA? A health care proxy is also called a health care agent. It's a person you choose to make medical decisions for you when you can't make them for yourself. In most cases, a proxy is a trusted friend or family member. A medical POA is legal paperwork that names your proxy. It may need to be: Signed. Notarized. Dated. Copied. Witnessed. Added to your medical record. You may also want to choose someone to handle your money if you can't do so. This is called a durable POA for finances. It's separate from a medical POA. You may choose your health care proxy or someone else to act as your agent in money matters. If you don't have a proxy, or if the proxy may not be acting in your best interest, a court may choose a guardian to act on your behalf. What is a living will? A living will is legal paperwork that states your wishes about medical care. Providers should keep a copy of it in your medical record. You may want to give a copy to family members or friends. You can also keep a card in your wallet to let loved ones know you have a living will and where they can find it. A living will may be used if: You're very sick with something that will end your life. You become disabled. You can't make decisions or speak for yourself. Your living will should include whether: To use or not use life support equipment. This may include machines to filter your blood or to help  you breathe. You want a DNR or DNAR order. This tells providers not to use CPR if your heart or breathing stops. To use or not use tube feeding. You want to be given foods and fluids. You want a type of comfort care called palliative care. This may be given when the goal for treatment becomes comfort rather than a cure. You want to donate your organs and tissues. A living will doesn't say what to do with your money and property if you pass away. What is a DNR or DNAR? A DNR or DNAR order is a request not to have CPR. If you don't have one of these orders, a provider will try to help you if your heart stops or you stop breathing.  If you plan to have surgery, talk with your provider about your DNR or DNAR order. What happens if I don't have an advance directive? Each state has its own laws about advance directives. Some states assign family decision makers to act on your behalf if you don't have an advance directive.  Check with your provider, attorney, or state representative about the laws in your state. Where to find more information Each state has its own laws about advance directives. You can look up these laws at: https://rodriguez-phillips.com/ This information is not intended to replace advice given to you by your health care provider. Make sure you discuss any questions you have with your  health care provider. Document Revised: 10/14/2022 Document Reviewed: 10/14/2022 Elsevier Patient Education  2024 Elsevier Inc.Health Maintenance, Female Adopting a healthy lifestyle and getting preventive care are important in promoting health and wellness. Ask your health care provider about: The right schedule for you to have regular tests and exams. Things you can do on your own to prevent diseases and keep yourself healthy. What should I know about diet, weight, and exercise? Eat a healthy diet  Eat a diet that includes plenty of vegetables, fruits, low-fat dairy products, and lean protein. Do not eat a lot of  foods that are high in solid fats, added sugars, or sodium. Maintain a healthy weight Body mass index (BMI) is used to identify weight problems. It estimates body fat based on height and weight. Your health care provider can help determine your BMI and help you achieve or maintain a healthy weight. Get regular exercise Get regular exercise. This is one of the most important things you can do for your health. Most adults should: Exercise for at least 150 minutes each week. The exercise should increase your heart rate and make you sweat (moderate-intensity exercise). Do strengthening exercises at least twice a week. This is in addition to the moderate-intensity exercise. Spend less time sitting. Even light physical activity can be beneficial. Watch cholesterol and blood lipids Have your blood tested for lipids and cholesterol at 68 years of age, then have this test every 5 years. Have your cholesterol levels checked more often if: Your lipid or cholesterol levels are high. You are older than 68 years of age. You are at high risk for heart disease. What should I know about cancer screening? Depending on your health history and family history, you may need to have cancer screening at various ages. This may include screening for: Breast cancer. Cervical cancer. Colorectal cancer. Skin cancer. Lung cancer. What should I know about heart disease, diabetes, and high blood pressure? Blood pressure and heart disease High blood pressure causes heart disease and increases the risk of stroke. This is more likely to develop in people who have high blood pressure readings or are overweight. Have your blood pressure checked: Every 3-5 years if you are 64-68 years of age. Every year if you are 77 years old or older. Diabetes Have regular diabetes screenings. This checks your fasting blood sugar level. Have the screening done: Once every three years after age 50 if you are at a normal weight and have a  low risk for diabetes. More often and at a younger age if you are overweight or have a high risk for diabetes. What should I know about preventing infection? Hepatitis B If you have a higher risk for hepatitis B, you should be screened for this virus. Talk with your health care provider to find out if you are at risk for hepatitis B infection. Hepatitis C Testing is recommended for: Everyone born from [birth year] through 1965. Anyone with known risk factors for hepatitis C. Sexually transmitted infections (STIs) Get screened for STIs, including gonorrhea and chlamydia, if: You are sexually active and are younger than 68 years of age. You are older than 68 years of age and your health care provider tells you that you are at risk for this type of infection. Your sexual activity has changed since you were last screened, and you are at increased risk for chlamydia or gonorrhea. Ask your health care provider if you are at risk. Ask your health care provider about whether you are at high  risk for HIV. Your health care provider may recommend a prescription medicine to help prevent HIV infection. If you choose to take medicine to prevent HIV, you should first get tested for HIV. You should then be tested every 3 months for as long as you are taking the medicine. Pregnancy If you are about to stop having your period (premenopausal) and you may become pregnant, seek counseling before you get pregnant. Take 400 to 800 micrograms (mcg) of folic acid every day if you become pregnant. Ask for birth control (contraception) if you want to prevent pregnancy. Osteoporosis and menopause Osteoporosis is a disease in which the bones lose minerals and strength with aging. This can result in bone fractures. If you are 31 years old or older, or if you are at risk for osteoporosis and fractures, ask your health care provider if you should: Be screened for bone loss. Take a calcium or vitamin D supplement to lower your risk of  fractures. Be given hormone replacement therapy (HRT) to treat symptoms of menopause. Follow these instructions at home: Alcohol use Do not drink alcohol if: Your health care provider tells you not to drink. You are pregnant, may be pregnant, or are planning to become pregnant. If you drink alcohol: Limit how much you have to: 0-1 drink a day. Know how much alcohol is in your drink. In the [place] one drink equals one 12 oz bottle of beer (355 mL), one 5 oz glass of wine (148 mL), or one 1 oz glass of hard liquor (44 mL). Lifestyle Do not use any products that contain nicotine or tobacco. These products include cigarettes, chewing tobacco, and vaping devices, such as e-cigarettes. If you need help quitting, ask your health care provider. Do not use street drugs. Do not share needles. Ask your health care provider for help if you need support or information about quitting drugs. General instructions Schedule regular health, dental, and eye exams. Stay current with your vaccines. Tell your health care provider if: You often feel depressed. You have ever been abused or do not feel safe at home. Summary Adopting a healthy lifestyle and getting preventive care are important in promoting health and wellness. Follow your health care provider's instructions about healthy diet, exercising, and getting tested or screened for diseases. Follow your health care provider's instructions on monitoring your cholesterol and blood pressure. This information is not intended to replace advice given to you by your health care provider. Make sure you discuss any questions you have with your health care provider. Document Revised: 10/16/2020 Document Reviewed: 10/16/2020 Elsevier Patient Education  2024 [employer].

## 2023-09-22 NOTE — Progress Notes (Signed)
 Subjective:   Faith Montgomery is a 68 y.o. female who presents for Medicare Annual (Subsequent) preventive examination.  Visit Complete: Virtual I connected with  Willaim Bane on 09/22/23 by a audio enabled telemedicine application and verified that I am speaking with the correct person using two identifiers.  Interactive audio and video telecommunications were attempted between this provider and patient, however failed, due to patient having technical difficulties OR patient did not have access to video capability.  We continued and completed visit with audio only.  Patient Location: Home  Provider Location: Home Office  I discussed the limitations of evaluation and management by telemedicine. The patient expressed understanding and agreed to proceed.  Vital Signs: Because this visit was a virtual/telehealth visit, some criteria may be missing or patient reported. Any vitals not documented were not able to be obtained and vitals that have been documented are patient reported.  Patient Medicare AWV questionnaire was completed by the patient on 09/22/2023; I have confirmed that all information answered by patient is correct and no changes since this date.        Objective:    There were no vitals filed for this visit. There is no height or weight on file to calculate BMI.     02/27/2022    8:32 AM 12/16/2018    9:24 AM 07/09/2018   10:53 AM 06/25/2018    3:33 PM 08/22/2014    8:11 AM 08/08/2014    3:13 PM  Advanced Directives  Does Patient Have a Medical Advance Directive? Yes No Yes Yes No No  Type of Estate agent of [place];Living will  Healthcare Power of Attorney     Does patient want to make changes to medical advance directive?   No - Patient declined     Copy of Healthcare Power of Attorney in Chart? No - copy requested  No - copy requested     Would patient like information on creating a medical advance directive?  No - Patient declined         Current Medications (verified) Outpatient Encounter Medications as of 09/22/2023  Medication Sig   calcium-vitamin D (OSCAL WITH D) 500-200 MG-UNIT tablet Take 1 tablet by mouth.   hydrochlorothiazide (HYDRODIURIL) 25 MG tablet Take 1 tablet (25 mg total) by mouth daily. PT NEEDS OV FOR FURTHER REFILLS, LAST COURTESY FILL   losartan (COZAAR) 50 MG tablet Take 1 tablet (50 mg total) by mouth daily.   No facility-administered encounter medications on file as of 09/22/2023.    Allergies (verified) Benazepril   History: Past Medical History:  Diagnosis Date   Hypertension    Right carpal tunnel syndrome 04/22/2018   Past Surgical History:  Procedure Laterality Date   CARPAL TUNNEL RELEASE Right 07/09/2018   Procedure: RIGHT CARPAL TUNNEL RELEASE;  Surgeon: Betha Loa, MD;  Location: [facility];  Service: Orthopedics;  Laterality: Right;   HAMMER TOE SURGERY Bilateral    KNEE ARTHROSCOPY     both knees at different times   Family History  Problem Relation Age of Onset   Heart disease Father    Diabetes Father    Stroke Brother 42       stroke   Colon cancer Neg Hx    Social History   Socioeconomic History   Marital status: Married    Spouse name: Not on file   Number of children: Not on file   Years of education: Not on file   Highest education  level: 12th grade  Occupational History   Occupation: Youth worker: [employer]   Occupation: Regulatory affairs officer  Tobacco Use   Smoking status: Never   Smokeless tobacco: Never  Substance and Sexual Activity   Alcohol use: No   Drug use: No   Sexual activity: Yes    Birth control/protection: Post-menopausal  Other Topics Concern   Not on file  Social History Narrative   Married. Lives with husband.    Entered 2015--Daughter and Granddaughter live with them now.    3 children. 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      Walks with husband every night for 30 minutes.( This was in 2014)   In 2015--pt says they walk sometimes.    She is active with granddaughter even on days they do not walk.   Social Drivers of Corporate investment banker Strain: Low Risk  (06/05/2023)   Overall Financial Resource Strain (CARDIA)    Difficulty of Paying Living Expenses: Not hard at all  Food Insecurity: No Food Insecurity (06/05/2023)   Hunger Vital Sign    Worried About Running Out of Food in the Last Year: Never true    Ran Out of Food in the Last Year: Never true  Transportation Needs: No Transportation Needs (06/05/2023)   PRAPARE - Administrator, Civil Service (Medical): No    Lack of Transportation (Non-Medical): No  Physical Activity: Insufficiently Active (06/05/2023)   Exercise Vital Sign    Days of Exercise per Week: 2 days    Minutes of Exercise per Session: 10 min  Stress: No Stress Concern Present (06/05/2023)   [employer] of Occupational Health - Occupational Stress Questionnaire    Feeling of Stress : Not at all  Social Connections: Socially Integrated (06/05/2023)   Social Connection and Isolation Panel [NHANES]    Frequency of Communication with Friends and Family: More than three times a week    Frequency of Social Gatherings with Friends and Family: Three times a week    Attends Religious Services: More than 4 times per year    Active Member of Clubs or Organizations: Yes    Attends Banker Meetings: More than 4 times per year    Marital Status: Married    Tobacco Counseling Counseling given: Not Answered   Clinical Intake:                        Activities of Daily Living     No data to display           Patient Care Team: Donita Brooks, MD as PCP - General (Family Medicine) Dorena Bodo, PA-C as Physician Assistant (Physician Assistant)  Indicate any recent Medical  Services you may have received from other than [facility] providers in the past year (date may be approximate).     Assessment:   This is a routine wellness examination for Faith Montgomery.  Hearing/Vision screen No results found.   Goals Addressed   None   Depression Screen    05/14/2022   11:06 AM 02/27/2022  8:36 AM 02/27/2022    8:29 AM 07/20/2020    9:46 AM 07/20/2019    3:42 PM 03/12/2018    3:07 PM 03/25/2017    4:14 PM  PHQ 2/9 Scores  PHQ - 2 Score 1 0 0 0 0 0 0  PHQ- 9 Score       0    Fall Risk    05/14/2022   11:06 AM 02/27/2022    8:33 AM 07/20/2020    9:46 AM  Fall Risk   Falls in the past year? 0 0 0  Number falls in past yr: 0 0   Injury with Fall? 0 0   Risk for fall due to : No Fall Risks No Fall Risks Impaired balance/gait;Impaired mobility  Follow up Falls prevention discussed Falls prevention discussed Falls evaluation completed    MEDICARE RISK AT HOME:    TIMED UP AND GO:  Was the test performed?  No    Cognitive Function:        02/27/2022    8:34 AM  6CIT Screen  What Year? 0 points  What month? 0 points  What time? 0 points  Count back from 20 0 points  Months in reverse 0 points  Repeat phrase 0 points  Total Score 0 points    Immunizations Immunization History  Administered Date(s) Administered   Influenza,inj,Quad PF,6+ Mos 03/25/2013, 03/28/2014, 04/05/2015, 04/10/2016, 03/25/2017, 03/12/2018   Influenza-Unspecified 03/25/2012, 03/11/2019, 05/01/2020   Moderna Covid-19 Fall Seasonal Vaccine 23yrs & older 05/18/2022   Moderna Sars-Covid-2 Vaccination 08/08/2019, 09/11/2019, 05/01/2020   PNEUMOCOCCAL CONJUGATE-20 12/17/2021   Td 03/25/2012   Tdap 03/25/2012    TDAP status: Up to date  Flu Vaccine status: Up to date  Pneumococcal vaccine status: Declined,  Education has been provided regarding the importance of this vaccine but patient still declined. Advised may receive this vaccine at local pharmacy or Health Dept. Aware to provide  a copy of the vaccination record if obtained from local pharmacy or Health Dept. Verbalized acceptance and understanding.   Covid-19 vaccine status: Information provided on how to obtain vaccines.   Qualifies for Shingles Vaccine? No   Zostavax completed No   Shingrix Completed?: No.    Education has been provided regarding the importance of this vaccine. Patient has been advised to call insurance company to determine out of pocket expense if they have not yet received this vaccine. Advised may also receive vaccine at local pharmacy or Health Dept. Verbalized acceptance and understanding.  Screening Tests Health Maintenance  Topic Date Due   Hepatitis C Screening  Never done   Zoster Vaccines- Shingrix (1 of 2) Never done   DEXA SCAN  Never done   COVID-19 Vaccine (5 - 2024-25 season) 02/09/2023   INFLUENZA VACCINE  01/09/2024   Colonoscopy  08/21/2024   Medicare Annual Wellness (AWV)  09/21/2024   MAMMOGRAM  11/12/2024   Pneumonia Vaccine 51+ Years old  Completed   HPV VACCINES  Aged Out   Meningococcal B Vaccine  Aged Out   DTaP/Tdap/Td  Discontinued    Health Maintenance  Health Maintenance Due  Topic Date Due   Hepatitis C Screening  Never done   Zoster Vaccines- Shingrix (1 of 2) Never done   DEXA SCAN  Never done   COVID-19 Vaccine (5 - 2024-25 season) 02/09/2023    Colorectal cancer screening: Type of screening: Colonoscopy. Completed 08/22/2023. Repeat every 10 years  Mammogram status: Completed 11/13/2022. Repeat every year  Bone Density status: Ordered pt  declined. Pt provided with contact info and advised to call to schedule appt.  Lung Cancer Screening: (Low Dose CT Chest recommended if Age 69-80 years, 20 pack-year currently smoking OR have quit w/in 15years.) does not qualify.   Lung Cancer Screening Referral: na  Additional Screening:  Hepatitis C Screening: does not qualify; Completed   Vision Screening: Recommended annual ophthalmology exams for early  detection of glaucoma and other disorders of the eye. Is the patient up to date with their annual eye exam?  Yes  Who is the provider or what is the name of the office in which the patient attends annual eye exams? [facility] If pt is not established with a provider, would they like to be referred to a provider to establish care? Yes .   Dental Screening: Recommended annual dental exams for proper oral hygiene  Diabetic Foot Exam: not diabetic  Community Resource Referral / Chronic Care Management: CRR required this visit?  No   CCM required this visit?  No     Plan:     I have personally reviewed and noted the following in the patient's chart:   Medical and social history Use of alcohol, tobacco or illicit drugs  Current medications and supplements including opioid prescriptions. Patient is not currently taking opioid prescriptions. Functional ability and status Nutritional status Physical activity Advanced directives List of other physicians Hospitalizations, surgeries, and ER visits in previous 12 months Vitals Screenings to include cognitive, depression, and falls Referrals and appointments  In addition, I have reviewed and discussed with patient certain preventive protocols, quality metrics, and best practice recommendations. A written personalized care plan for preventive services as well as general preventive health recommendations were provided to patient.     Faith Montgomery   09/22/2023   After Visit Summary: (MyChart) Due to this being a telephonic visit, the after visit summary with patients personalized plan was offered to patient via MyChart   Nurse Notes:   Faith Montgomery , Thank you for taking time to come for your Medicare Wellness Visit. I appreciate your ongoing commitment to your health goals. Please review the following plan we discussed and let me know if I can assist you in the future.   These are the goals we discussed:  Goals      CCM Expected  Outcome:  Monitor, Self-Manage and Reduce Symptoms of:     Make sure family is happy and taken care of. And continue to move and keep going.      Exercise 3x per week (30 min per time)     Try to exercise 3 days per week.         This is a list of the screening recommended for you and due dates:  Health Maintenance  Topic Date Due   Hepatitis C Screening  Never done   Zoster (Shingles) Vaccine (1 of 2) Never done   DEXA scan (bone density measurement)  Never done   COVID-19 Vaccine (5 - 2024-25 season) 02/09/2023   Flu Shot  01/09/2024   Colon Cancer Screening  08/21/2024   Medicare Annual Wellness Visit  09/21/2024   Mammogram  11/12/2024   Pneumonia Vaccine  Completed   HPV Vaccine  Aged Out   Meningitis B Vaccine  Aged Out   DTaP/Tdap/Td vaccine  Discontinued

## 2023-10-06 NOTE — Progress Notes (Signed)
 Subjective:   Faith Montgomery is a 68 y.o. female who presents for Medicare Annual (Subsequent) preventive examination.  Visit Complete: Virtual I connected with  Shaun Deiters on 09/22/2023 by a audio enabled telemedicine application and verified that I am speaking with the correct person using two identifiers.  Interactive audio and video telecommunications were attempted between this provider and patient, however failed, due to patient having technical difficulties OR patient did not have access to video capability.  We continued and completed visit with audio only.  Patient Location: Home  Provider Location: Home Office  I discussed the limitations of evaluation and management by telemedicine. The patient expressed understanding and agreed to proceed.  Vital Signs: Because this visit was a virtual/telehealth visit, some criteria may be missing or patient reported. Any vitals not documented were not able to be obtained and vitals that have been documented are patient reported.  Patient Medicare AWV questionnaire was completed by the patient on 09/22/2023; I have confirmed that all information answered by patient is correct and no changes since this date.  Cardiac Risk Factors include: hypertension;obesity (BMI >30kg/m2)     Objective:    Today's Vitals   09/22/23 0808  BP: 136/82  Pulse: 69  Temp: 98.7 F (37.1 C)  TempSrc: Oral  Weight: 189 lb (85.7 kg)  Height: 5\' 3"  (1.6 m)   Body mass index is 33.48 kg/m.     09/22/2023    8:19 AM 02/27/2022    8:32 AM 12/16/2018    9:24 AM 07/09/2018   10:53 AM 06/25/2018    3:33 PM 08/22/2014    8:11 AM 08/08/2014    3:13 PM  Advanced Directives  Does Patient Have a Medical Advance Directive? No Yes No Yes Yes No No  Type of Special educational needs teacher of [place];Living will  Healthcare Power of Attorney     Does patient want to make changes to medical advance directive?    No - Patient declined     Copy of Healthcare  Power of Attorney in Chart?  No - copy requested  No - copy requested     Would patient like information on creating a medical advance directive? Yes (ED - Information included in AVS)  No - Patient declined        Current Medications (verified) Outpatient Encounter Medications as of 09/22/2023  Medication Sig   calcium-vitamin D  (OSCAL WITH D) 500-200 MG-UNIT tablet Take 1 tablet by mouth.   hydrochlorothiazide  (HYDRODIURIL ) 25 MG tablet Take 1 tablet (25 mg total) by mouth daily. PT NEEDS OV FOR FURTHER REFILLS, LAST COURTESY FILL   losartan  (COZAAR ) 50 MG tablet Take 1 tablet (50 mg total) by mouth daily.   No facility-administered encounter medications on file as of 09/22/2023.    Allergies (verified) Benazepril   History: Past Medical History:  Diagnosis Date   Hypertension    Right carpal tunnel syndrome 04/22/2018   Past Surgical History:  Procedure Laterality Date   CARPAL TUNNEL RELEASE Right 07/09/2018   Procedure: RIGHT CARPAL TUNNEL RELEASE;  Surgeon: Brunilda Capra, MD;  Location: [facility];  Service: Orthopedics;  Laterality: Right;   HAMMER TOE SURGERY Bilateral    KNEE ARTHROSCOPY     both knees at different times   Family History  Problem Relation Age of Onset   Heart disease Father    Diabetes Father    Colon cancer Brother    Stroke Brother 31  stroke   Social History   Socioeconomic History   Marital status: Married    Spouse name: Not on file   Number of children: Not on file   Years of education: Not on file   Highest education level: 12th grade  Occupational History   Occupation: Youth worker: [employer]   Occupation: Regulatory affairs officer  Tobacco Use   Smoking status: Never   Smokeless tobacco: Never  Substance and Sexual Activity   Alcohol use: No   Drug use: No   Sexual activity: Yes    Birth control/protection: Post-menopausal  Other Topics Concern   Not on file  Social History Narrative    Married. Lives with husband.    Entered 2015--Daughter and Granddaughter live with them now.    3 children. 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      Walks with husband every night for 30 minutes.( This was in 2014)   In 2015--pt says they walk sometimes.    She is active with granddaughter even on days they do not walk.   Social Drivers of Corporate investment banker Strain: Low Risk  (09/22/2023)   Overall Financial Resource Strain (CARDIA)    Difficulty of Paying Living Expenses: Not hard at all  Food Insecurity: No Food Insecurity (09/22/2023)   Hunger Vital Sign    Worried About Running Out of Food in the Last Year: Never true    Ran Out of Food in the Last Year: Never true  Transportation Needs: No Transportation Needs (09/22/2023)   PRAPARE - Administrator, Civil Service (Medical): No    Lack of Transportation (Non-Medical): No  Physical Activity: Sufficiently Active (09/22/2023)   Exercise Vital Sign    Days of Exercise per Week: 4 days    Minutes of Exercise per Session: 70 min  Stress: No Stress Concern Present (09/22/2023)   [employer] of Occupational Health - Occupational Stress Questionnaire    Feeling of Stress : Not at all  Social Connections: Socially Integrated (09/22/2023)   Social Connection and Isolation Panel [NHANES]    Frequency of Communication with Friends and Family: More than three times a week    Frequency of Social Gatherings with Friends and Family: Three times a week    Attends Religious Services: More than 4 times per year    Active Member of Clubs or Organizations: Yes    Attends Engineer, structural: More than 4 times per year    Marital Status: Married    Tobacco Counseling Counseling given: Not Answered   Clinical Intake:  Pre-visit preparation completed: Yes  Pain : No/denies pain     BMI -  recorded: 33 Nutritional Status: BMI > 30  Obese Diabetes: No  How often do you need to have someone help you when you read instructions, pamphlets, or other written materials from your doctor or pharmacy?: 1 - Never What is the last grade level you completed in school?: 12th  Interpreter Needed?: No      Activities of Daily Living    09/22/2023    8:14 AM  In your  present state of health, do you have any difficulty performing the following activities:  Hearing? 1  Vision? 0  Difficulty concentrating or making decisions? 0  Walking or climbing stairs? 0  Dressing or bathing? 0  Doing errands, shopping? 0  Preparing Food and eating ? N  Using the Toilet? N  In the past six months, have you accidently leaked urine? N  Do you have problems with loss of bowel control? N  Managing your Medications? N  Managing your Finances? N  Housekeeping or managing your Housekeeping? N    Patient Care Team: Austine Lefort, MD as PCP - General (Family Medicine) Owen Blowers as Physician Assistant (Physician Assistant)  Indicate any recent Medical Services you may have received from other than [facility] providers in the past year (date may be approximate).     Assessment:   This is a routine wellness examination for Faith Montgomery.  Hearing/Vision screen No results found.   Goals Addressed             This Visit's Progress    CCM Expected Outcome:  Monitor, Self-Manage and Reduce Symptoms of:       Make sure family is happy and taken care of. And continue to move and keep going.       Depression Screen    09/22/2023    8:26 AM 09/22/2023    8:22 AM 05/14/2022   11:06 AM 02/27/2022    8:36 AM 02/27/2022    8:29 AM 07/20/2020    9:46 AM 07/20/2019    3:42 PM  PHQ 2/9 Scores  PHQ - 2 Score 0 0 1 0 0 0 0    Fall Risk    09/22/2023    8:19 AM 05/14/2022   11:06 AM 02/27/2022    8:33 AM 07/20/2020    9:46 AM  Fall Risk   Falls in the past year? 0 0 0 0  Number falls in past yr:  0 0 0   Injury with Fall? 0 0 0   Risk for fall due to : No Fall Risks No Fall Risks No Fall Risks Impaired balance/gait;Impaired mobility  Follow up  Falls prevention discussed Falls prevention discussed Falls evaluation completed    MEDICARE RISK AT HOME: Medicare Risk at Home Any stairs in or around the home?: No If so, are there any without handrails?: No Home free of loose throw rugs in walkways, pet beds, electrical cords, etc?: Yes Adequate lighting in your home to reduce risk of falls?: Yes Life alert?: No Use of a cane, walker or w/c?: No Grab bars in the bathroom?: Yes Shower chair or bench in shower?: No Elevated toilet seat or a handicapped toilet?: Yes  TIMED UP AND GO:  Was the test performed?  No    Cognitive Function:        09/22/2023    8:26 AM 02/27/2022    8:34 AM  6CIT Screen  What Year? 0 points 0 points  What month? 0 points 0 points  What time? 0 points 0 points  Count back from 20 0 points 0 points  Months in reverse 2 points 0 points  Repeat phrase 0 points 0 points  Total Score 2 points 0 points    Immunizations Immunization History  Administered Date(s) Administered   Influenza,inj,Quad PF,6+ Mos 03/25/2013, 03/28/2014, 04/05/2015, 04/10/2016, 03/25/2017, 03/12/2018   Influenza-Unspecified 03/25/2012, 03/11/2019, 05/01/2020   Moderna Covid-19 Fall Seasonal Vaccine 24yrs & older 05/18/2022   Moderna Sars-Covid-2 Vaccination 08/08/2019,  09/11/2019, 05/01/2020   PNEUMOCOCCAL CONJUGATE-20 12/17/2021   Td 03/25/2012   Tdap 03/25/2012    TDAP status: Up to date  Flu Vaccine status: Up to date  Pneumococcal vaccine status: Declined,  Education has been provided regarding the importance of this vaccine but patient still declined. Advised may receive this vaccine at local pharmacy or Health Dept. Aware to provide a copy of the vaccination record if obtained from local pharmacy or Health Dept. Verbalized acceptance and understanding.   Covid-19  vaccine status: Information provided on how to obtain vaccines.   Qualifies for Shingles Vaccine? No   Zostavax completed No   Shingrix Completed?: No.    Education has been provided regarding the importance of this vaccine. Patient has been advised to call insurance company to determine out of pocket expense if they have not yet received this vaccine. Advised may also receive vaccine at local pharmacy or Health Dept. Verbalized acceptance and understanding.  Screening Tests Health Maintenance  Topic Date Due   Hepatitis C Screening  Never done   Zoster Vaccines- Shingrix (1 of 2) Never done   DEXA SCAN  Never done   COVID-19 Vaccine (5 - 2024-25 season) 02/09/2023   INFLUENZA VACCINE  01/09/2024   Colonoscopy  08/21/2024   Medicare Annual Wellness (AWV)  09/21/2024   MAMMOGRAM  11/12/2024   Pneumonia Vaccine 22+ Years old  Completed   HPV VACCINES  Aged Out   Meningococcal B Vaccine  Aged Out   DTaP/Tdap/Td  Discontinued    Health Maintenance  Health Maintenance Due  Topic Date Due   Hepatitis C Screening  Never done   Zoster Vaccines- Shingrix (1 of 2) Never done   DEXA SCAN  Never done   COVID-19 Vaccine (5 - 2024-25 season) 02/09/2023    Colorectal cancer screening: Type of screening: Colonoscopy. Completed 08/22/2023. Repeat every 10 years  Mammogram status: Completed 11/13/2022. Repeat every year  Bone Density status: Ordered pt declined. Pt provided with contact info and advised to call to schedule appt.  Lung Cancer Screening: (Low Dose CT Chest recommended if Age 15-80 years, 20 pack-year currently smoking OR have quit w/in 15years.) does not qualify.   Lung Cancer Screening Referral: na  Additional Screening:  Hepatitis C Screening: does not qualify; Completed   Vision Screening: Recommended annual ophthalmology exams for early detection of glaucoma and other disorders of the eye. Is the patient up to date with their annual eye exam?  Yes  Who is the  provider or what is the name of the office in which the patient attends annual eye exams? [facility] If pt is not established with a provider, would they like to be referred to a provider to establish care? Yes .   Dental Screening: Recommended annual dental exams for proper oral hygiene  Diabetic Foot Exam: not diabetic  Community Resource Referral / Chronic Care Management: CRR required this visit?  No   CCM required this visit?  No     Plan:     I have personally reviewed and noted the following in the patient's chart:   Medical and social history Use of alcohol, tobacco or illicit drugs  Current medications and supplements including opioid prescriptions. Patient is not currently taking opioid prescriptions. Functional ability and status Nutritional status Physical activity Advanced directives List of other physicians Hospitalizations, surgeries, and ER visits in previous 12 months Vitals Screenings to include cognitive, depression, and falls Referrals and appointments  In addition, I have reviewed and discussed  with patient certain preventive protocols, quality metrics, and best practice recommendations. A written personalized care plan for preventive services as well as general preventive health recommendations were provided to patient.     Alvina Axon   10/06/2023   After Visit Summary: (MyChart) Due to this being a telephonic visit, the after visit summary with patients personalized plan was offered to patient via MyChart   Nurse Notes:   Ms. Hanif , Thank you for taking time to come for your Medicare Wellness Visit. I appreciate your ongoing commitment to your health goals. Please review the following plan we discussed and let me know if I can assist you in the future.   These are the goals we discussed:  Goals      CCM Expected Outcome:  Monitor, Self-Manage and Reduce Symptoms of:     Make sure family is happy and taken care of. And continue to move and  keep going.      Exercise 3x per week (30 min per time)     Try to exercise 3 days per week.         This is a list of the screening recommended for you and due dates:  Health Maintenance  Topic Date Due   Hepatitis C Screening  Never done   Zoster (Shingles) Vaccine (1 of 2) Never done   DEXA scan (bone density measurement)  Never done   COVID-19 Vaccine (5 - 2024-25 season) 02/09/2023   Flu Shot  01/09/2024   Colon Cancer Screening  08/21/2024   Medicare Annual Wellness Visit  09/21/2024   Mammogram  11/12/2024   Pneumonia Vaccine  Completed   HPV Vaccine  Aged Out   Meningitis B Vaccine  Aged Out   DTaP/Tdap/Td vaccine  Discontinued

## 2023-11-11 ENCOUNTER — Encounter: Payer: Self-pay | Admitting: Family Medicine

## 2023-11-11 ENCOUNTER — Ambulatory Visit: Admitting: Family Medicine

## 2023-11-11 VITALS — BP 136/78 | HR 74 | Temp 98.2°F | Ht 63.0 in | Wt 187.2 lb

## 2023-11-11 DIAGNOSIS — I10 Essential (primary) hypertension: Secondary | ICD-10-CM | POA: Diagnosis not present

## 2023-11-11 DIAGNOSIS — Z1211 Encounter for screening for malignant neoplasm of colon: Secondary | ICD-10-CM | POA: Insufficient documentation

## 2023-11-11 LAB — COMPREHENSIVE METABOLIC PANEL WITH GFR
AG Ratio: 1.5 (calc) (ref 1.0–2.5)
ALT: 19 U/L (ref 6–29)
AST: 17 U/L (ref 10–35)
Albumin: 4.3 g/dL (ref 3.6–5.1)
Alkaline phosphatase (APISO): 84 U/L (ref 37–153)
BUN: 18 mg/dL (ref 7–25)
CO2: 29 mmol/L (ref 20–32)
Calcium: 9.5 mg/dL (ref 8.6–10.4)
Chloride: 99 mmol/L (ref 98–110)
Creat: 0.69 mg/dL (ref 0.50–1.05)
Globulin: 2.9 g/dL (ref 1.9–3.7)
Glucose, Bld: 90 mg/dL (ref 65–99)
Potassium: 3.7 mmol/L (ref 3.5–5.3)
Sodium: 138 mmol/L (ref 135–146)
Total Bilirubin: 0.7 mg/dL (ref 0.2–1.2)
Total Protein: 7.2 g/dL (ref 6.1–8.1)
eGFR: 95 mL/min/{1.73_m2} (ref >=60)

## 2023-11-11 LAB — CBC WITH DIFFERENTIAL/PLATELET
Absolute Lymphocytes: 978 {cells}/uL (ref 850–3900)
Absolute Monocytes: 528 {cells}/uL (ref 200–950)
Basophils Absolute: 42 {cells}/uL (ref 0–200)
Basophils Relative: 0.7 %
Eosinophils Absolute: 180 {cells}/uL (ref 15–500)
Eosinophils Relative: 3 %
HCT: 42.5 % (ref 35.0–45.0)
Hemoglobin: 14 g/dL (ref 11.7–15.5)
MCH: 29.5 pg (ref 27.0–33.0)
MCHC: 32.9 g/dL (ref 32.0–36.0)
MCV: 89.7 fL (ref 80.0–100.0)
MPV: 10.4 fL (ref 7.5–12.5)
Monocytes Relative: 8.8 %
Neutro Abs: 4272 {cells}/uL (ref 1500–7800)
Neutrophils Relative %: 71.2 %
Platelets: 216 10*3/uL (ref 140–400)
RBC: 4.74 10*6/uL (ref 3.80–5.10)
RDW: 13.1 % (ref 11.0–15.0)
Total Lymphocyte: 16.3 %
WBC: 6 10*3/uL (ref 3.8–10.8)

## 2023-11-11 LAB — LIPID PANEL
Cholesterol: 175 mg/dL (ref <200)
HDL: 59 mg/dL (ref >=50)
LDL Cholesterol (Calc): 98 mg/dL
Non-HDL Cholesterol (Calc): 116 mg/dL (ref <130)
Total CHOL/HDL Ratio: 3 (calc) (ref <5.0)
Triglycerides: 88 mg/dL (ref <150)

## 2023-11-11 NOTE — Progress Notes (Signed)
 Subjective:    Patient ID: Faith Montgomery, female    DOB: 09-Feb-1956, 68 y.o.   MRN: [id number]  HPI Patient is here today for follow-up of her hypertension.  Her blood pressure today is adequately controlled at 136/78.  The patient denies any chest pain or shortness of breath or dyspnea on exertion.  She is due for fasting lab work today.  She does have a skin tag just inferior and posterior to the left axilla.  We discussed having me remove this today but she prefers to leave it alone. Past Medical History:  Diagnosis Date   Hypertension    Right carpal tunnel syndrome 04/22/2018   Past Surgical History:  Procedure Laterality Date   CARPAL TUNNEL RELEASE Right 07/09/2018   Procedure: RIGHT CARPAL TUNNEL RELEASE;  Surgeon: Brunilda Capra, MD;  Location: [facility];  Service: Orthopedics;  Laterality: Right;   HAMMER TOE SURGERY Bilateral    KNEE ARTHROSCOPY     both knees at different times    Current Outpatient Medications on File Prior to Visit  Medication Sig Dispense Refill   calcium-vitamin D  (OSCAL WITH D) 500-200 MG-UNIT tablet Take 1 tablet by mouth.     hydrochlorothiazide  (HYDRODIURIL ) 25 MG tablet Take 1 tablet (25 mg total) by mouth daily. PT NEEDS OV FOR FURTHER REFILLS, LAST COURTESY FILL 90 tablet 3   losartan  (COZAAR ) 50 MG tablet Take 1 tablet (50 mg total) by mouth daily. 90 tablet 3   No current facility-administered medications on file prior to visit.     Allergies  Allergen Reactions   Benazepril     Cough---09/2013   Social History   Socioeconomic History   Marital status: Married    Spouse name: Not on file   Number of children: Not on file   Years of education: Not on file   Highest education level: 12th grade  Occupational History   Occupation: Youth worker: [employer]   Occupation: Regulatory affairs officer  Tobacco Use   Smoking status: Never   Smokeless tobacco: Never  Substance and Sexual Activity   Alcohol  use: No   Drug use: No   Sexual activity: Yes    Birth control/protection: Post-menopausal  Other Topics Concern   Not on file  Social History Narrative   Married. Lives with husband.    Entered 2015--Daughter and Granddaughter live with them now.    3 children. 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      Walks with husband every night for 30 minutes.( This was in 2014)   In 2015--pt says they walk sometimes.    She is active with granddaughter even on days they do not walk.   Social Drivers of Corporate investment banker Strain: Low Risk  (09/22/2023)   Overall Financial Resource Strain (CARDIA)    Difficulty of Paying Living Expenses: Not hard at all  Food Insecurity: No Food Insecurity (09/22/2023)   Hunger Vital Sign    Worried About Running Out of Food in the Last Year: Never true    Ran Out of Food in the Last Year: Never  true  Transportation Needs: No Transportation Needs (09/22/2023)   PRAPARE - Administrator, Civil Service (Medical): No    Lack of Transportation (Non-Medical): No  Physical Activity: Sufficiently Active (09/22/2023)   Exercise Vital Sign    Days of Exercise per Week: 4 days    Minutes of Exercise per Session: 70 min  Stress: No Stress Concern Present (09/22/2023)   [employer] of Occupational Health - Occupational Stress Questionnaire    Feeling of Stress : Not at all  Social Connections: Socially Integrated (09/22/2023)   Social Connection and Isolation Panel [NHANES]    Frequency of Communication with Friends and Family: More than three times a week    Frequency of Social Gatherings with Friends and Family: Three times a week    Attends Religious Services: More than 4 times per year    Active Member of Clubs or Organizations: Yes    Attends Banker Meetings: More than 4 times per year    Marital Status:  Married  Catering manager Violence: Not At Risk (09/22/2023)   Humiliation, Afraid, Rape, and Kick questionnaire    Fear of Current or Ex-Partner: No    Emotionally Abused: No    Physically Abused: No    Sexually Abused: No   Family History  Problem Relation Age of Onset   Heart disease Father    Diabetes Father    Colon cancer Brother    Stroke Brother 50       stroke     Review of Systems  All other systems reviewed and are negative.      Objective:   Physical Exam Vitals reviewed.  Constitutional:      General: She is not in acute distress.    Appearance: Normal appearance. She is normal weight. She is not ill-appearing, toxic-appearing or diaphoretic.  HENT:     Head: Normocephalic and atraumatic.     Right Ear: Tympanic membrane, ear canal and external ear normal. There is no impacted cerumen.     Left Ear: Tympanic membrane, ear canal and external ear normal. There is no impacted cerumen.     Nose: Nose normal. No congestion or rhinorrhea.     Mouth/Throat:     Mouth: Mucous membranes are moist.     Pharynx: Oropharynx is clear. No oropharyngeal exudate or posterior oropharyngeal erythema.  Eyes:     General: No scleral icterus.       Right eye: No discharge.        Left eye: No discharge.     Extraocular Movements: Extraocular movements intact.     Conjunctiva/sclera: Conjunctivae normal.     Pupils: Pupils are equal, round, and reactive to light.  Neck:     Vascular: No carotid bruit.  Cardiovascular:     Rate and Rhythm: Normal rate and regular rhythm.     Pulses: Normal pulses.     Heart sounds: Normal heart sounds. No murmur heard.    No friction rub. No gallop.  Pulmonary:     Effort: Pulmonary effort is normal. No respiratory distress.     Breath sounds: Normal breath sounds. No stridor. No wheezing, rhonchi or rales.  Chest:     Chest wall: No tenderness.  Abdominal:     General: Abdomen is flat. Bowel sounds are normal. There is no distension.      Palpations: Abdomen is soft. There is no mass.     Tenderness: There is no abdominal tenderness. There is no right  CVA tenderness, left CVA tenderness, guarding or rebound.     Hernia: No hernia is present.  Musculoskeletal:     Cervical back: Normal range of motion and neck supple. No rigidity or tenderness.     Right lower leg: No edema.     Left lower leg: No edema.  Lymphadenopathy:     Cervical: No cervical adenopathy.  Skin:    General: Skin is warm.     Coloration: Skin is not jaundiced or pale.     Findings: No bruising, erythema, lesion or rash.  Neurological:     General: No focal deficit present.     Mental Status: She is alert and oriented to person, place, and time. Mental status is at baseline.     Cranial Nerves: No cranial nerve deficit.     Sensory: No sensory deficit.     Motor: No weakness.     Coordination: Coordination normal.     Gait: Gait normal.     Deep Tendon Reflexes: Reflexes normal.  Psychiatric:        Mood and Affect: Mood normal.        Behavior: Behavior normal.        Thought Content: Thought content normal.        Judgment: Judgment normal.           Assessment & Plan:  Benign essential HTN - Plan: CBC with Differential/Platelet, Comprehensive metabolic panel with GFR, Lipid panel Blood pressure is well-controlled.  I will check a CBC a CMP and a lipid panel.  I would like to monitor her potassium along with renal function.  I will check a fasting lipid panel and I would like her LDL cholesterol to be less than 100.  I recommended compression hose due to some mild edema due to venous insufficiency in both legs

## 2023-11-13 ENCOUNTER — Ambulatory Visit: Payer: Self-pay | Admitting: Family Medicine

## 2024-01-15 ENCOUNTER — Other Ambulatory Visit (HOSPITAL_COMMUNITY): Payer: Self-pay | Admitting: Family Medicine

## 2024-01-15 DIAGNOSIS — Z1231 Encounter for screening mammogram for malignant neoplasm of breast: Secondary | ICD-10-CM

## 2024-01-22 ENCOUNTER — Other Ambulatory Visit: Payer: Self-pay | Admitting: Family Medicine

## 2024-01-22 ENCOUNTER — Ambulatory Visit (HOSPITAL_COMMUNITY)
Admission: RE | Admit: 2024-01-22 | Discharge: 2024-01-22 | Disposition: A | Discharge status: Home / Self Care | Source: Ambulatory Visit | Attending: Family Medicine | Admitting: Family Medicine

## 2024-01-22 ENCOUNTER — Encounter (HOSPITAL_COMMUNITY): Payer: Self-pay

## 2024-01-22 DIAGNOSIS — Z1231 Encounter for screening mammogram for malignant neoplasm of breast: Secondary | ICD-10-CM | POA: Diagnosis not present

## 2024-06-04 ENCOUNTER — Ambulatory Visit: Payer: Self-pay

## 2024-06-04 ENCOUNTER — Other Ambulatory Visit: Payer: Self-pay

## 2024-06-04 MED ORDER — OSELTAMIVIR PHOSPHATE 75 MG PO CAPS: 75.0000 mg | ORAL_CAPSULE | Freq: Two times a day (BID) | ORAL | 0 refills | Status: AC

## 2024-06-04 NOTE — Telephone Encounter (Signed)
 FYI Only or Action Required?: Action required by provider: + home flu test. Requesting medication be sent to pharmacy. Husband positive for flu as well. .  Patient was last seen in primary care on 11/11/2023 by Duanne Butler DASEN, MD.  Called Nurse Triage reporting Influenza.  Symptoms began yesterday.  Interventions attempted: OTC medications: Tylenol  and Ibuprofen and Rest, hydration, or home remedies.  Symptoms are: unchanged.  Triage Disposition: Call PCP Within 24 Hours  Patient/caregiver understands and will follow disposition?: Yes  Message from [facility] sent at 06/04/2024  8:08 AM EST  Summary: Positive for the flu   Reason for Triage: Pt called to report that she has tested positive for the flu, is symptomatic. Has cough, congestion, seeking Rx and medical advice.  Best contact: [phone]         Reason for Disposition  Patient is HIGH RISK (e.g., age > 64 years, pregnant, HIV+, or chronic medical condition)  Answer Assessment - Initial Assessment Questions Patient reports positive home flu test along with her husband. Reports moderate to severe cough with congestion. Endorses feeling poorly. Requesting medication be sent into her pharmacy.  1. WORST SYMPTOM: What is your worst symptom? (e.g., cough, runny nose, muscle aches, headache, sore throat, fever)      cough 2. ONSET: When did your flu symptoms start?      Started on Christmas morning 3. COUGH: How bad is the cough?       Moderate to severe 4. RESPIRATORY DISTRESS: Describe your breathing.      Breathing is okay per patient 5. FEVER: Do you have a fever? If Yes, ask: What is your temperature, how was it measured, and when did it start?     No fever 6. EXPOSURE: Were you exposed to someone with influenza?       Were exposed to brother in law who was positive for flu.  7. FLU VACCINE: Did you get a flu shot this year?     yes 8. HIGH RISK DISEASE: Do you have any chronic medical  problems? (e.g., heart or lung disease, asthma, weak immune system, or other HIGH RISK conditions)     no 9. OTHER SYMPTOMS: Do you have any other symptoms?  (e.g., runny nose, muscle aches, headache, sore throat)       congestion  Protocols used: Influenza (Flu) - [facility]

## 2024-09-22 ENCOUNTER — Encounter
